# Patient Record
Sex: Male | Born: 1960 | Race: White | Hispanic: No | Marital: Married | Smoking: Never smoker
Health system: Southern US, Community
[De-identification: ages and names within clinical notes are randomized; demographics above are authoritative.]

## PROBLEM LIST (undated history)

## (undated) DIAGNOSIS — M1990 Unspecified osteoarthritis, unspecified site: Secondary | ICD-10-CM

## (undated) DIAGNOSIS — E785 Hyperlipidemia, unspecified: Secondary | ICD-10-CM

## (undated) HISTORY — PX: OTHER SURGICAL HISTORY: SHX169

## (undated) HISTORY — DX: Hyperlipidemia, unspecified: E78.5

## (undated) HISTORY — PX: COLONOSCOPY: SHX174

## (undated) HISTORY — DX: Unspecified osteoarthritis, unspecified site: M19.90

---

## 2014-01-30 DIAGNOSIS — F32A Depression, unspecified: Secondary | ICD-10-CM | POA: Insufficient documentation

## 2014-01-30 DIAGNOSIS — F329 Major depressive disorder, single episode, unspecified: Secondary | ICD-10-CM | POA: Insufficient documentation

## 2014-05-27 DIAGNOSIS — E785 Hyperlipidemia, unspecified: Secondary | ICD-10-CM | POA: Insufficient documentation

## 2014-05-27 DIAGNOSIS — Z0000 Encounter for general adult medical examination without abnormal findings: Secondary | ICD-10-CM | POA: Insufficient documentation

## 2014-05-27 DIAGNOSIS — M25551 Pain in right hip: Secondary | ICD-10-CM | POA: Insufficient documentation

## 2014-05-27 DIAGNOSIS — M25552 Pain in left hip: Secondary | ICD-10-CM

## 2015-06-25 DIAGNOSIS — Z9889 Other specified postprocedural states: Secondary | ICD-10-CM | POA: Insufficient documentation

## 2015-06-25 DIAGNOSIS — M160 Bilateral primary osteoarthritis of hip: Secondary | ICD-10-CM | POA: Insufficient documentation

## 2017-02-07 ENCOUNTER — Encounter: Payer: Self-pay | Admitting: Urology

## 2017-02-07 ENCOUNTER — Ambulatory Visit (INDEPENDENT_AMBULATORY_CARE_PROVIDER_SITE_OTHER): Payer: 59 | Admitting: Urology

## 2017-02-07 VITALS — BP 120/79 | HR 56 | Ht 70.0 in | Wt 170.0 lb

## 2017-02-07 DIAGNOSIS — K4090 Unilateral inguinal hernia, without obstruction or gangrene, not specified as recurrent: Secondary | ICD-10-CM | POA: Diagnosis not present

## 2017-02-07 DIAGNOSIS — N4340 Spermatocele of epididymis, unspecified: Secondary | ICD-10-CM

## 2017-02-07 NOTE — Progress Notes (Signed)
02/07/2017 12:26 PM   Adam [name] 1961/06/17 [id number]  Referring provider: No referring provider defined for this encounter.  Chief Complaint  Patient presents with  . Groin Swelling    New Patient    HPI: 56 year old male who presents today with a right hemiscrotal mass. He reports feeling an enlarged area, approximately size of a grape for the past 9+ months. It is not painful. Since he first noticed it, its remains stable in size.  In addition, he does also report some bulging in his right inguinal area which is new over the past several months. He is concerning may have a hernia. He denies any abdominal symptoms or pain.  He denies any voiding issues.  PMH: Past Medical History:  Diagnosis Date  . Arthritis   . Hyperlipemia     Surgical History: Past Surgical History:  Procedure Laterality Date  . none      Home Medications:  Allergies as of 02/07/2017   No Known Allergies     Medication List       Accurate as of 02/07/17 12:26 PM. Always use your most recent med list.          aspirin EC 81 MG tablet Take 81 mg by mouth.   pravastatin 10 MG tablet Commonly known as:  PRAVACHOL Take 10 mg by mouth.       Allergies: No Known Allergies  Family History: Family History  Problem Relation Age of Onset  . Prostate cancer Neg Hx   . Bladder Cancer Neg Hx   . Kidney cancer Neg Hx     Social History:  reports that he has never smoked. He has never used smokeless tobacco. He reports that he does not drink alcohol or use drugs.  ROS: UROLOGY Frequent Urination?: No Hard to postpone urination?: No Burning/pain with urination?: No Get up at night to urinate?: No Leakage of urine?: No Urine stream starts and stops?: No Trouble starting stream?: No Do you have to strain to urinate?: No Blood in urine?: No Urinary tract infection?: No Sexually transmitted disease?: No Injury to kidneys or bladder?: No Painful intercourse?: No Weak stream?:  No Erection problems?: No Penile pain?: No  Gastrointestinal Nausea?: No Vomiting?: No Indigestion/heartburn?: No Diarrhea?: No Constipation?: No  Constitutional Fever: No Night sweats?: No Weight loss?: No Fatigue?: No  Skin Skin rash/lesions?: No Itching?: No  Eyes Blurred vision?: No Double vision?: No  Ears/Nose/Throat Sore throat?: No Sinus problems?: No  Hematologic/Lymphatic Swollen glands?: No Easy bruising?: No  Cardiovascular Leg swelling?: No Chest pain?: No  Respiratory Cough?: No Shortness of breath?: No  Endocrine Excessive thirst?: No  Musculoskeletal Back pain?: No Joint pain?: No  Neurological Headaches?: No Dizziness?: No  Psychologic Depression?: No Anxiety?: No  Physical Exam: BP 120/79   Pulse (!) 56   Ht 5\' 10"  (1.778 m)   Wt 170 lb (77.1 kg)   BMI 24.39 kg/m   Constitutional:  Alert and oriented, No acute distress. HEENT: [place] AT, moist mucus membranes.  Trachea midline, no masses. Cardiovascular: No clubbing, cyanosis, or edema. Respiratory: Normal respiratory effort, no increased work of breathing. GI: Abdomen is soft, nontender, nondistended, no abdominal masses.  Bulge at the right inguinal area appreciated with Valsalva. GU: Circumcised phallus with orthotopic meatus. Bilateral descended testicles.  3 cm right epididymal mass, well-circumscribed, most consistent with spermatocele. Normal left epididymis. Normal testicles bilaterally without tenderness or masses. Skin: No rashes, bruises or suspicious lesions. Lymph: No inguinal adenopathy. Neurologic: Grossly  intact, no focal deficits, moving all 4 extremities. Psychiatric: Normal mood and affect.  Laboratory Data: N/a  Urinalysis N/a  Pertinent Imaging: n/a  Assessment & Plan:    1. Spermatocele Right 3 cm epididymal mass most consistent with spermatocele versus epididymal cyst.  Scrotal ultrasound was offered to confirm diagnosis, although quite  confident based on physical exam that this is benign.  No testicular lesions identified.  Options including continued observation versus spermatocele ectomy were discussed. At this point time, he has minimal bother therefore would prefer observation.  2. Right inguinal hernia Bulging in the right inguinal area concerning for a small hernia Observation versus referral to Gen. surgery discussed- again prefers observation of this time Wanting for incarceration was discussed  F/u prn  Vanna Scotland, MD  [facility] [address] [place], [place] [address] [phone]

## 2017-06-07 DIAGNOSIS — M1611 Unilateral primary osteoarthritis, right hip: Secondary | ICD-10-CM | POA: Diagnosis not present

## 2017-06-25 ENCOUNTER — Telehealth: Payer: Self-pay | Admitting: Cardiovascular Disease

## 2017-06-25 DIAGNOSIS — E785 Hyperlipidemia, unspecified: Secondary | ICD-10-CM

## 2017-06-25 NOTE — Telephone Encounter (Signed)
Dr. Eden EmmsNishan stated he would read his study.

## 2017-06-25 NOTE — Telephone Encounter (Signed)
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 Eden EmmsNishan would be able to read his results. Please call back to discuss

## 2017-07-04 ENCOUNTER — Ambulatory Visit (INDEPENDENT_AMBULATORY_CARE_PROVIDER_SITE_OTHER)
Admission: RE | Admit: 2017-07-04 | Discharge: 2017-07-04 | Disposition: A | Discharge status: Home / Self Care | Payer: Self-pay | Source: Ambulatory Visit | Attending: Cardiovascular Disease | Admitting: Cardiovascular Disease

## 2017-07-04 DIAGNOSIS — E785 Hyperlipidemia, unspecified: Secondary | ICD-10-CM

## 2017-07-11 ENCOUNTER — Telehealth: Payer: Self-pay

## 2017-07-11 NOTE — Telephone Encounter (Signed)
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 MD MRN [id number] DOB May 25, 2061  Please call these results to patient. [phone]. He was a self referral for scan  Thanks, Misty StanleyLisa

## 2017-07-11 NOTE — Telephone Encounter (Signed)
LEFT MSG TO SCHEDULE NP APPT . ANY PROVIDER

## 2017-07-13 NOTE — Telephone Encounter (Signed)
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 MD MRN [id number] DOB 11/16/1960  Please call these results to patient. [phone]. He was a self referral for scan  Thanks, Lisa   

## 2017-07-13 NOTE — Telephone Encounter (Signed)
Called pt to schedule f/u appt. Pt states he does not want to make an appt at this time, and will call us when he is ready to schedule

## 2017-07-16 ENCOUNTER — Other Ambulatory Visit: Payer: Self-pay | Admitting: *Deleted

## 2017-07-16 NOTE — Patient Outreach (Signed)
[facility] Care Management  07/16/2017  Adam HuskyDavid J [name] Oct 22, 1960 [id number]   Subjective: Telephone call to patient's home number, no answer, left HIPAA compliant voicemail message, and requested call back. Telephone call from patient's home number, spoke with patient, and HIPAA verified.  Discussed [facility] Transition of care follow up, preoperative call follow up, patient voiced understanding, and is in agreement to both types of follow up.   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   Patient voices understanding of medical diagnosis, pending surgery, and treatment plan. States wife will be primary caregiver post hospitalization, will assist with activities of daily living / home management as needed.  States he is accessing the following [facility] benefits: outpatient pharmacy, hospital indemnity (not chosen benefit),  will not need family medical leave act (FMLA), and is planing to take 2 weeks of vacation. Discussed Advanced Directives, advised of [employer] Employee Spiritual Care Advanced Directives document completion benefit, patient voices understanding, and  declined to access benefit at this time.  Patient states he does not have any preoperative questions, care coordination, disease management, disease monitoring, transportation, community resource, or pharmacy needs at this time. States he is very appreciative of the follow up and is in agreement to receive [facility] Care [facility] information post transition of care follow up.     Objective: Per KPN (Knowledge Performance Now, point of care tool) and chart review,  Patient to be admitted 07/30/17 for TOTAL HIP ARTHROPLASTY ANTERIOR APPROACH at [facility].   Patient has a history of hyperlipidemia, Spermatocele, and Right inguinal hernia.       Assessment:  Received [facility] Preoperative / Transition of  care referral on 07/12/17.   Preoperative call completed, and transition of care follow up pending notification of patient discharge.     Plan: RNCM will call patient for  telephone outreach attempt, transition of care follow up, within 3 business days of hospital discharge notification.      Aziza Stuckert H. Gardiner Barefootooper RN, BSN, CCM [facility] Care Management [facility] Telephonic CM Phone: [phone] Fax: [phone]

## 2017-07-17 ENCOUNTER — Ambulatory Visit: Payer: Self-pay | Admitting: Orthopedic Surgery

## 2017-07-19 ENCOUNTER — Encounter
Admission: RE | Admit: 2017-07-19 | Discharge: 2017-07-19 | Disposition: A | Discharge status: Home / Self Care | Payer: 59 | Source: Ambulatory Visit | Attending: Orthopedic Surgery | Admitting: Orthopedic Surgery

## 2017-07-19 ENCOUNTER — Other Ambulatory Visit: Payer: Self-pay

## 2017-07-19 DIAGNOSIS — Z01812 Encounter for preprocedural laboratory examination: Secondary | ICD-10-CM | POA: Insufficient documentation

## 2017-07-19 LAB — BASIC METABOLIC PANEL
ANION GAP: 10 (ref 5–15)
BUN: 18 mg/dL (ref 6–20)
CHLORIDE: 104 mmol/L (ref 101–111)
CO2: 25 mmol/L (ref 22–32)
Calcium: 8.9 mg/dL (ref 8.9–10.3)
Creatinine, Ser: 1.22 mg/dL (ref 0.61–1.24)
GFR calc Af Amer: >60 mL/min (ref >60)
GLUCOSE: 72 mg/dL (ref 65–99)
POTASSIUM: 3.5 mmol/L (ref 3.5–5.1)
Sodium: 139 mmol/L (ref 135–145)

## 2017-07-19 LAB — CBC
HEMATOCRIT: 44.7 % (ref 40.0–52.0)
Hemoglobin: 15.7 g/dL (ref 13.0–18.0)
MCH: 30.8 pg (ref 26.0–34.0)
MCHC: 35.1 g/dL (ref 32.0–36.0)
MCV: 87.8 fL (ref 80.0–100.0)
Platelets: 241 10*3/uL (ref 150–440)
RBC: 5.1 MIL/uL (ref 4.40–5.90)
RDW: 13.3 % (ref 11.5–14.5)
WBC: 6.9 10*3/uL (ref 3.8–10.6)

## 2017-07-19 LAB — URINALYSIS, ROUTINE W REFLEX MICROSCOPIC
BILIRUBIN URINE: NEGATIVE
Glucose, UA: NEGATIVE mg/dL
Hgb urine dipstick: NEGATIVE
Ketones, ur: NEGATIVE mg/dL
LEUKOCYTES UA: NEGATIVE
NITRITE: NEGATIVE
PH: 5 (ref 5.0–8.0)
Protein, ur: NEGATIVE mg/dL
SPECIFIC GRAVITY, URINE: 1.017 (ref 1.005–1.030)

## 2017-07-19 LAB — TYPE AND SCREEN
ABO/RH(D): O POS
Antibody Screen: NEGATIVE

## 2017-07-19 LAB — SURGICAL PCR SCREEN
MRSA, PCR: NEGATIVE
STAPHYLOCOCCUS AUREUS: NEGATIVE

## 2017-07-19 NOTE — Patient Instructions (Signed)
Your procedure is scheduled on: Monday, July 30, 2017 Report to Same Day Surgery on the 2nd floor in the Medical Mall. To find out your arrival time, please call [phone] between 1PM - 3PM on: Friday, July 27, 2017  REMEMBER: Instructions that are not followed completely may result in serious medical risk, up to and including death; or upon the discretion of your surgeon and anesthesiologist your surgery may need to be rescheduled.  Do not eat food after midnight the night before your procedure.  No gum chewing or hard candies.  You may however, drink CLEAR liquids up to 2 hours before you are scheduled to arrive at the hospital for your procedure.  Do not drink clear liquids within 2 hours of the start of your surgery.  Clear liquids include: - water  - apple juice without pulp - clear gatorade - black coffee or tea (Do NOT add anything to the coffee or tea) Do NOT drink anything that is not on this list.  No Alcohol for 24 hours before or after surgery.  No Smoking including e-cigarettes for 24 hours prior to surgery. No chewable tobacco products for at least 6 hours prior to surgery. No nicotine patches on the day of surgery.  On the morning of surgery brush your teeth with toothpaste and water, you may rinse your mouth with mouthwash if you wish. Do not swallow any  toothpaste of mouthwash.  Notify your doctor if there is any change in your medical condition (cold, fever, infection).  Do not wear jewelry, make-up, hairpins, clips or nail polish.  Do not wear lotions, powders, or perfumes. You may wear deodorant.  Do not shave 48 hours prior to surgery. Men may shave face and neck.  Contacts and dentures may not be worn into surgery.  Do not bring valuables to the hospital. [facility] is not responsible for any belongings or valuables.  TAKE THESE MEDICATIONS THE MORNING OF SURGERY WITH A SIP OF WATER:  none  Use CHG Soap as directed on instruction  sheet.  Starting February 3:  Stop aspirin and Anti-inflammatories such as Advil, Aleve, Ibuprofen, Motrin, Naproxen, Naprosyn, Goodie powder, or aspirin products. (May take Tylenol or Acetaminophen if needed.)  Starting February 3: Stop ANY OVER THE COUNTER supplements until after surgery.  If you are being admitted to the hospital overnight, leave your suitcase in the car. After surgery it may be brought to your room.  Please call the number above if you have any questions about these instructions.

## 2017-07-29 MED ORDER — CEFAZOLIN SODIUM-DEXTROSE 2-4 GM/100ML-% IV SOLN
2.0000 g | INTRAVENOUS | Status: AC
  Administered 2017-07-30: 2 g via INTRAVENOUS

## 2017-07-29 MED ORDER — TRANEXAMIC ACID 1000 MG/10ML IV SOLN
1000.0000 mg | INTRAVENOUS | Status: AC
  Administered 2017-07-30: 1000 mg via INTRAVENOUS
  Filled 2017-07-29: qty 10

## 2017-07-30 ENCOUNTER — Inpatient Hospital Stay
Admission: RE | Admit: 2017-07-30 | Discharge: 2017-07-31 | DRG: 470 | Disposition: A | Discharge status: Home / Self Care | Payer: 59 | Source: Ambulatory Visit | Attending: Orthopedic Surgery | Admitting: Orthopedic Surgery

## 2017-07-30 ENCOUNTER — Inpatient Hospital Stay: Payer: 59

## 2017-07-30 ENCOUNTER — Other Ambulatory Visit: Payer: Self-pay

## 2017-07-30 ENCOUNTER — Encounter: Payer: Self-pay | Admitting: *Deleted

## 2017-07-30 ENCOUNTER — Encounter
Admission: RE | Disposition: A | Discharge status: Home / Self Care | Payer: Self-pay | Source: Ambulatory Visit | Attending: Orthopedic Surgery

## 2017-07-30 ENCOUNTER — Inpatient Hospital Stay: Payer: 59 | Admitting: Anesthesiology

## 2017-07-30 DIAGNOSIS — M1611 Unilateral primary osteoarthritis, right hip: Principal | ICD-10-CM | POA: Diagnosis present

## 2017-07-30 DIAGNOSIS — Z79899 Other long term (current) drug therapy: Secondary | ICD-10-CM | POA: Diagnosis not present

## 2017-07-30 DIAGNOSIS — Z09 Encounter for follow-up examination after completed treatment for conditions other than malignant neoplasm: Secondary | ICD-10-CM

## 2017-07-30 DIAGNOSIS — E785 Hyperlipidemia, unspecified: Secondary | ICD-10-CM | POA: Diagnosis present

## 2017-07-30 DIAGNOSIS — Z419 Encounter for procedure for purposes other than remedying health state, unspecified: Secondary | ICD-10-CM

## 2017-07-30 DIAGNOSIS — Z96641 Presence of right artificial hip joint: Secondary | ICD-10-CM | POA: Diagnosis not present

## 2017-07-30 DIAGNOSIS — Z471 Aftercare following joint replacement surgery: Secondary | ICD-10-CM | POA: Diagnosis not present

## 2017-07-30 DIAGNOSIS — M25551 Pain in right hip: Secondary | ICD-10-CM | POA: Diagnosis present

## 2017-07-30 HISTORY — PX: TOTAL HIP ARTHROPLASTY: SHX124

## 2017-07-30 LAB — ABO/RH: ABO/RH(D): O POS

## 2017-07-30 SURGERY — ARTHROPLASTY, HIP, TOTAL, ANTERIOR APPROACH
Anesthesia: Spinal | Site: Hip | Laterality: Right | Wound class: Clean

## 2017-07-30 MED ORDER — ACETAMINOPHEN 650 MG RE SUPP: 650.0000 mg | RECTAL | Status: DC | PRN

## 2017-07-30 MED ORDER — FAMOTIDINE 20 MG PO TABS
ORAL_TABLET | ORAL | Status: AC
  Administered 2017-07-30: 20 mg via ORAL
  Filled 2017-07-30: qty 1

## 2017-07-30 MED ORDER — MAGNESIUM CITRATE PO SOLN
1.0000 | Freq: Once | ORAL | Status: DC | PRN
Start: 2017-07-30 — End: 2017-07-31
  Filled 2017-07-30: qty 296

## 2017-07-30 MED ORDER — DOCUSATE SODIUM 100 MG PO CAPS
100.0000 mg | ORAL_CAPSULE | Freq: Two times a day (BID) | ORAL | Status: DC
  Filled 2017-07-30: qty 1

## 2017-07-30 MED ORDER — SODIUM CHLORIDE 0.9 % IJ SOLN
INTRAMUSCULAR | Status: AC
  Filled 2017-07-30: qty 50

## 2017-07-30 MED ORDER — GABAPENTIN 300 MG PO CAPS
ORAL_CAPSULE | ORAL | Status: AC
  Administered 2017-07-30: 300 mg via ORAL
  Filled 2017-07-30: qty 1

## 2017-07-30 MED ORDER — OXYCODONE HCL 5 MG PO TABS: 5.0000 mg | ORAL_TABLET | Freq: Once | ORAL | Status: DC | PRN

## 2017-07-30 MED ORDER — BUPIVACAINE-EPINEPHRINE (PF) 0.25% -1:200000 IJ SOLN
INTRAMUSCULAR | Status: DC | PRN
  Administered 2017-07-30: 20 mL via PERINEURAL

## 2017-07-30 MED ORDER — PROPOFOL 500 MG/50ML IV EMUL
INTRAVENOUS | Status: AC
  Filled 2017-07-30: qty 100

## 2017-07-30 MED ORDER — LACTATED RINGERS IV SOLN
INTRAVENOUS | Status: DC
  Administered 2017-07-30: 17:00:00 via INTRAVENOUS

## 2017-07-30 MED ORDER — FAMOTIDINE 20 MG PO TABS
20.0000 mg | ORAL_TABLET | Freq: Once | ORAL | Status: AC
  Administered 2017-07-30: 20 mg via ORAL

## 2017-07-30 MED ORDER — BISACODYL 10 MG RE SUPP: 10.0000 mg | Freq: Every day | RECTAL | Status: DC | PRN

## 2017-07-30 MED ORDER — KETOROLAC TROMETHAMINE 15 MG/ML IJ SOLN
15.0000 mg | Freq: Four times a day (QID) | INTRAMUSCULAR | Status: AC
  Administered 2017-07-30 – 2017-07-31 (×4): 15 mg via INTRAVENOUS
  Filled 2017-07-30 (×4): qty 1

## 2017-07-30 MED ORDER — BUPIVACAINE-EPINEPHRINE (PF) 0.25% -1:200000 IJ SOLN
INTRAMUSCULAR | Status: AC
  Filled 2017-07-30: qty 20

## 2017-07-30 MED ORDER — METOCLOPRAMIDE HCL 5 MG/ML IJ SOLN: 5.0000 mg | Freq: Three times a day (TID) | INTRAMUSCULAR | Status: DC | PRN

## 2017-07-30 MED ORDER — MORPHINE SULFATE (PF) 2 MG/ML IV SOLN: 2.0000 mg | INTRAVENOUS | Status: DC | PRN

## 2017-07-30 MED ORDER — MENTHOL 3 MG MT LOZG
1.0000 | LOZENGE | OROMUCOSAL | Status: DC | PRN
  Filled 2017-07-30: qty 9

## 2017-07-30 MED ORDER — MIDAZOLAM HCL 2 MG/2ML IJ SOLN
INTRAMUSCULAR | Status: AC
  Filled 2017-07-30: qty 2

## 2017-07-30 MED ORDER — CHLORHEXIDINE GLUCONATE 4 % EX LIQD: 60.0000 mL | Freq: Once | CUTANEOUS | Status: DC

## 2017-07-30 MED ORDER — PROPOFOL 10 MG/ML IV BOLUS
INTRAVENOUS | Status: DC | PRN
  Administered 2017-07-30: 50 mg via INTRAVENOUS

## 2017-07-30 MED ORDER — PHENOL 1.4 % MT LIQD
1.0000 | OROMUCOSAL | Status: DC | PRN
  Filled 2017-07-30: qty 177

## 2017-07-30 MED ORDER — ASPIRIN EC 325 MG PO TBEC
325.0000 mg | DELAYED_RELEASE_TABLET | Freq: Every day | ORAL | Status: DC
  Administered 2017-07-31: 325 mg via ORAL
  Filled 2017-07-30: qty 1

## 2017-07-30 MED ORDER — ONDANSETRON HCL 4 MG/2ML IJ SOLN: 4.0000 mg | Freq: Four times a day (QID) | INTRAMUSCULAR | Status: DC | PRN

## 2017-07-30 MED ORDER — OXYCODONE HCL 5 MG/5ML PO SOLN: 5.0000 mg | Freq: Once | ORAL | Status: DC | PRN

## 2017-07-30 MED ORDER — OXYCODONE HCL 5 MG PO TABS: 5.0000 mg | ORAL_TABLET | ORAL | Status: DC | PRN

## 2017-07-30 MED ORDER — MIDAZOLAM HCL 5 MG/5ML IJ SOLN
INTRAMUSCULAR | Status: DC | PRN
  Administered 2017-07-30: 2 mg via INTRAVENOUS

## 2017-07-30 MED ORDER — SODIUM CHLORIDE 0.9 % IV SOLN
INTRAVENOUS | Status: DC | PRN
  Administered 2017-07-30: 25 ug/min via INTRAVENOUS

## 2017-07-30 MED ORDER — METOCLOPRAMIDE HCL 10 MG PO TABS: 5.0000 mg | ORAL_TABLET | Freq: Three times a day (TID) | ORAL | Status: DC | PRN

## 2017-07-30 MED ORDER — KETOROLAC TROMETHAMINE 60 MG/2ML IM SOLN
INTRAMUSCULAR | Status: AC
  Administered 2017-07-30: 15 mg
  Filled 2017-07-30: qty 2

## 2017-07-30 MED ORDER — ACETAMINOPHEN 500 MG PO TABS
1000.0000 mg | ORAL_TABLET | Freq: Four times a day (QID) | ORAL | Status: DC
  Administered 2017-07-30 – 2017-07-31 (×3): 1000 mg via ORAL
  Filled 2017-07-30 (×3): qty 2

## 2017-07-30 MED ORDER — FENTANYL CITRATE (PF) 100 MCG/2ML IJ SOLN: 25.0000 ug | INTRAMUSCULAR | Status: DC | PRN

## 2017-07-30 MED ORDER — MAGNESIUM HYDROXIDE 400 MG/5ML PO SUSP
30.0000 mL | Freq: Every day | ORAL | Status: DC | PRN
  Filled 2017-07-30: qty 30

## 2017-07-30 MED ORDER — ACETAMINOPHEN 500 MG PO TABS
1000.0000 mg | ORAL_TABLET | Freq: Once | ORAL | Status: AC
  Administered 2017-07-30: 1000 mg via ORAL

## 2017-07-30 MED ORDER — PRAVASTATIN SODIUM 20 MG PO TABS: 10.0000 mg | ORAL_TABLET | ORAL | Status: DC

## 2017-07-30 MED ORDER — OXYCODONE HCL 5 MG PO TABS: 10.0000 mg | ORAL_TABLET | ORAL | Status: DC | PRN

## 2017-07-30 MED ORDER — PROPOFOL 500 MG/50ML IV EMUL
INTRAVENOUS | Status: DC | PRN
  Administered 2017-07-30: 100 ug/kg/min via INTRAVENOUS

## 2017-07-30 MED ORDER — ONDANSETRON HCL 4 MG PO TABS: 4.0000 mg | ORAL_TABLET | Freq: Four times a day (QID) | ORAL | Status: DC | PRN

## 2017-07-30 MED ORDER — ACETAMINOPHEN 325 MG PO TABS
650.0000 mg | ORAL_TABLET | ORAL | Status: DC | PRN
  Administered 2017-07-31: 650 mg via ORAL
  Filled 2017-07-30: qty 2

## 2017-07-30 MED ORDER — CEFAZOLIN SODIUM-DEXTROSE 2-4 GM/100ML-% IV SOLN
INTRAVENOUS | Status: AC
  Filled 2017-07-30: qty 100

## 2017-07-30 MED ORDER — PHENYLEPHRINE HCL 10 MG/ML IJ SOLN
INTRAMUSCULAR | Status: DC | PRN
  Administered 2017-07-30 (×2): 100 ug via INTRAVENOUS

## 2017-07-30 MED ORDER — BUPIVACAINE HCL (PF) 0.5 % IJ SOLN
INTRAMUSCULAR | Status: DC | PRN
  Administered 2017-07-30: 3 mL

## 2017-07-30 MED ORDER — LACTATED RINGERS IV SOLN
INTRAVENOUS | Status: DC
  Administered 2017-07-30 (×2): via INTRAVENOUS

## 2017-07-30 MED ORDER — SENNA 8.6 MG PO TABS
1.0000 | ORAL_TABLET | Freq: Two times a day (BID) | ORAL | Status: DC
  Filled 2017-07-30: qty 1

## 2017-07-30 MED ORDER — SODIUM CHLORIDE 0.9 % IR SOLN
Status: DC | PRN
  Administered 2017-07-30: 2000 mL

## 2017-07-30 MED ORDER — ACETAMINOPHEN 500 MG PO TABS
ORAL_TABLET | ORAL | Status: AC
  Administered 2017-07-30: 1000 mg via ORAL
  Filled 2017-07-30: qty 2

## 2017-07-30 MED ORDER — CEFAZOLIN SODIUM-DEXTROSE 1-4 GM/50ML-% IV SOLN
INTRAVENOUS | Status: AC
  Administered 2017-07-30: 1 g via INTRAVENOUS
  Filled 2017-07-30: qty 50

## 2017-07-30 MED ORDER — GABAPENTIN 300 MG PO CAPS
300.0000 mg | ORAL_CAPSULE | Freq: Once | ORAL | Status: AC
  Administered 2017-07-30: 300 mg via ORAL

## 2017-07-30 MED ORDER — CEFAZOLIN SODIUM-DEXTROSE 1-4 GM/50ML-% IV SOLN
1.0000 g | Freq: Four times a day (QID) | INTRAVENOUS | Status: AC
  Administered 2017-07-30 (×2): 1 g via INTRAVENOUS
  Filled 2017-07-30: qty 50

## 2017-07-30 MED ORDER — BACITRACIN 50000 UNITS IM SOLR
INTRAMUSCULAR | Status: AC
  Filled 2017-07-30: qty 2

## 2017-07-30 MED ORDER — BUPIVACAINE HCL (PF) 0.5 % IJ SOLN
INTRAMUSCULAR | Status: AC
  Filled 2017-07-30: qty 10

## 2017-07-30 MED ORDER — ONDANSETRON HCL 4 MG/2ML IJ SOLN
INTRAMUSCULAR | Status: AC
  Filled 2017-07-30: qty 2

## 2017-07-30 MED ORDER — ONDANSETRON HCL 4 MG/2ML IJ SOLN
INTRAMUSCULAR | Status: DC | PRN
  Administered 2017-07-30: 4 mg via INTRAVENOUS

## 2017-07-30 MED ORDER — SEVOFLURANE IN SOLN
RESPIRATORY_TRACT | Status: AC
  Filled 2017-07-30: qty 250

## 2017-07-30 SURGICAL SUPPLY — 48 items
BIT DRILL STRL 25MM (BIT) ×1 IMPLANT
BLADE SAGITTAL WIDE XTHICK NO (BLADE) ×3 IMPLANT
BLADE SURG 10 STRL SS SAFETY (BLADE) ×3 IMPLANT
BRUSH SCRUB EZ  4% CHG (MISCELLANEOUS) ×2
BRUSH SCRUB EZ 4% CHG (MISCELLANEOUS) ×1 IMPLANT
CAPT HIP TOTAL 2 ×3 IMPLANT
CHLORAPREP W/TINT 26ML (MISCELLANEOUS) ×3 IMPLANT
COVER TABLE BACK 60X90 (DRAPES) ×3 IMPLANT
DRAPE C-ARM 42X72 X-RAY (DRAPES) ×3 IMPLANT
DRAPE SHEET LG 3/4 BI-LAMINATE (DRAPES) ×6 IMPLANT
DRAPE STERI IOBAN 125X83 (DRAPES) ×3 IMPLANT
DRAPE TABLE BACK 80X90 (DRAPES) ×3 IMPLANT
DRAPE U-SHAPE 47X51 STRL (DRAPES) ×3 IMPLANT
DRILL BIT STERILE 25MM (BIT) ×3
DRSG AQUACEL AG ADV 3.5X10 (GAUZE/BANDAGES/DRESSINGS) ×3 IMPLANT
DRSG AQUACEL AG ADV 3.5X14 (GAUZE/BANDAGES/DRESSINGS) ×3 IMPLANT
ELECT BLADE 6.5 EXT (BLADE) ×3 IMPLANT
ELECT REM PT RETURN 9FT ADLT (ELECTROSURGICAL) ×3
ELECTRODE REM PT RTRN 9FT ADLT (ELECTROSURGICAL) ×1 IMPLANT
GAUZE PETRO XEROFOAM 1X8 (MISCELLANEOUS) ×3 IMPLANT
GLOVE INDICATOR 8.0 STRL GRN (GLOVE) ×3 IMPLANT
GLOVE SURG ORTHO 8.0 STRL STRW (GLOVE) ×3 IMPLANT
GOWN STRL REUS W/ TWL LRG LVL3 (GOWN DISPOSABLE) ×1 IMPLANT
GOWN STRL REUS W/ TWL XL LVL3 (GOWN DISPOSABLE) ×1 IMPLANT
GOWN STRL REUS W/TWL LRG LVL3 (GOWN DISPOSABLE) ×2
GOWN STRL REUS W/TWL XL LVL3 (GOWN DISPOSABLE) ×2
HOOD PEEL AWAY FLYTE STAYCOOL (MISCELLANEOUS) ×9 IMPLANT
IV NS 1000ML (IV SOLUTION) ×2
IV NS 1000ML BAXH (IV SOLUTION) ×1 IMPLANT
KIT PATIENT CARE HANA TABLE (KITS) ×3 IMPLANT
KIT TURNOVER CYSTO (KITS) ×3 IMPLANT
MAT BLUE FLOOR 46X72 FLO (MISCELLANEOUS) ×3 IMPLANT
NDL SAFETY ECLIPSE 18X1.5 (NEEDLE) ×2 IMPLANT
NEEDLE HYPO 18GX1.5 SHARP (NEEDLE) ×4
NEEDLE HYPO 22GX1.5 SAFETY (NEEDLE) ×3 IMPLANT
NEEDLE SPNL 20GX3.5 QUINCKE YW (NEEDLE) ×3 IMPLANT
PACK HIP PROSTHESIS (MISCELLANEOUS) ×3 IMPLANT
PADDING CAST BLEND 4X4 NS (MISCELLANEOUS) ×6 IMPLANT
PILLOW ABDUCTION MEDIUM (MISCELLANEOUS) ×3 IMPLANT
PULSAVAC PLUS IRRIG FAN TIP (DISPOSABLE) ×3
SPONGE LAP 18X18 5 PK (GAUZE/BANDAGES/DRESSINGS) IMPLANT
STAPLER PROXIMATE SKIN (STAPLE) ×3 IMPLANT
SUT BONE WAX W31G (SUTURE) ×3 IMPLANT
SUT DVC 2 QUILL PDO  T11 36X36 (SUTURE) ×2
SUT DVC 2 QUILL PDO T11 36X36 (SUTURE) ×1 IMPLANT
SUT VIC AB 2-0 CT1 18 (SUTURE) ×3 IMPLANT
SYR 20CC LL (SYRINGE) ×3 IMPLANT
TIP FAN IRRIG PULSAVAC PLUS (DISPOSABLE) ×1 IMPLANT

## 2017-07-30 NOTE — H&P (Signed)
The patient has been re-examined, and the chart reviewed, and there have been no interval changes to the documented history and physical.  Plan a right total hip replacement today.  Anesthesia is not consulted regarding a peripheral nerve block for post-operative pain.  The risks, benefits, and alternatives have been discussed at length, and the patient is willing to proceed.     

## 2017-07-30 NOTE — Progress Notes (Signed)
Pt with full sensation. Up and walked to bathroom to void with no difficulty.

## 2017-07-30 NOTE — OR Nursing (Signed)
Patient awake and alert. He denies pain. Right hip sressing dry and intact. Bladder scanned for 575 mls. I + O cathed for 650 mls of clear yellow urine. Patient with some movement of both feet Spinal level is L2 bilaterally.

## 2017-07-30 NOTE — OR Nursing (Signed)
Right hip dressing dry and intact. Abductor pillow in place. Spinal level at L1 bilaterally. Right foot  Dp/Pt pulses are 2+/2+. Patient complaining of left hand index finger numbness. Will monitor.

## 2017-07-30 NOTE — OR Nursing (Signed)
Patient with slight decrease in spinal level. Down to groin level bilaterally. He denies pain at this time.

## 2017-07-30 NOTE — Transfer of Care (Signed)
Immediate Anesthesia Transfer of Care Note  Patient: Adam HuskyDavid J Steptoe  Procedure(s) Performed: TOTAL HIP ARTHROPLASTY ANTERIOR APPROACH (Right Hip)  Patient Location: PACU  Anesthesia Type:Spinal  Level of Consciousness: sedated  Airway & Oxygen Therapy: Patient Spontanous Breathing and Patient connected to face mask oxygen  Post-op Assessment: Report given to RN and Post -op Vital signs reviewed and stable  Post vital signs: Reviewed and stable  Last Vitals:  Vitals:   07/30/17 0619  BP: 125/86  Pulse: 82  Resp: 14  Temp: 36.7 C  SpO2: 99%    Last Pain:  Vitals:   07/30/17 0619  TempSrc: Tympanic  PainSc: 3          Complications: No apparent anesthesia complications

## 2017-07-30 NOTE — Op Note (Signed)
07/30/2017  9:58 AM  PATIENT:  Adam [name]   MRN: [id number]  PRE-OPERATIVE DIAGNOSIS:  Osteoarthritis right hip   POST-OPERATIVE DIAGNOSIS: Same  Procedure: Right Total Hip Replacement  Surgeon: Dola ArgyleJames R. Odis LusterBowers, MD   Assist: Altamese CabalMaurice Jones, PA-C  Anesthesia: Spinal   EBL: 200 mL   Specimens: None   Drains: None   Components used: A size 7 Polarstem Smith and Nephew, R3 size 54 mm shell, and a 36 mm Oxinium head    Description of the procedure in detail: After informed consent was obtained and the appropriate extremity marked in the pre-operative holding area, the patient was taken to the operating room and placed in the supine position on the fracture table. All pressure points were well padded and bilateral lower extremities were place in traction spars. The hip was prepped and draped in standard sterile fashion. A spinal anesthetic had been delivered by the anesthesia team. The skin and subcutaneous tissues were injected with a mixture of Marcaine with epinephrine for post-operative pain. A longitudinal incision approximately 10 cm in length was carried out from the anterior superior iliac spine to the greater trochanter. The tensor fascia was divided and blunt dissection was taken down to the level of the joint capsule. The lateral circumflex vessels were cauterized. Deep retractors were placed and a portion of the anterior capsule was excised. Using fluoroscopy the neck cut was planned and carried out with a sagittal saw. The head was passed from the field with use of a corkscrew and hip skid. Deep retractors were placed along the acetabulum and the degenerative labrum and large osteophytes were removed with a Rongeur. The cup was sequentially reamed to a size 54 mm. The wound was irrigated and using fluoroscopy the size 54 mm cup was impacted in to anatomic position. A single screw was placed followed by a threaded hole cover. The final liner was impacted in to position. Attention  was then turned to the proximal femur. The leg was placed in extension and external rotation. The canal was opened and sequentially broached to a size 7. The trial components were placed and the hip relocated. The components were found to be in good position using fluoroscopy. The hip was dislocated and the trial components removed. The final components were impacted in to position and the hip relocated. The final components were again check with fluoroscopy and found to be in good position. Hemostasis was achieved with electrocautery. The deep capsule was injected with Marcaine and epinephrine. The wound was irrigated with bacitracin laced normal saline and the tensor fascia closed with #2 Quill suture. The subcutaneous tissues were closed with 2-0 vicryl and staples for the skin. A sterile dressing was applied and an abduction pillow. Patient tolerated the procedure well and there were no apparent complication. Patient was taken to the recovery room in good condition.   Adam SmilesJames Mathew Postiglione, MD

## 2017-07-30 NOTE — Anesthesia Procedure Notes (Signed)
Date/Time: 07/30/2017 8:00 AM Performed by: Junious SilkNoles, Rochelle Larue, CRNA Pre-anesthesia Checklist: Patient identified, Emergency Drugs available, Suction available, Patient being monitored and Timeout performed Oxygen Delivery Method: Simple face mask

## 2017-07-30 NOTE — Anesthesia Preprocedure Evaluation (Signed)
Anesthesia Evaluation  Patient identified by MRN, date of birth, ID band Patient awake    Reviewed: Allergy & Precautions, H&P , NPO status , Patient's Chart, lab work & pertinent test results  Airway Mallampati: III  TM Distance: <3 FB Neck ROM: full    Dental  (+) Chipped   Pulmonary neg pulmonary ROS, neg shortness of breath,           Cardiovascular Exercise Tolerance: Good (-) angina(-) Past MI and (-) DOE negative cardio ROS       Neuro/Psych negative neurological ROS  negative psych ROS   GI/Hepatic negative GI ROS, Neg liver ROS, neg GERD  ,  Endo/Other  negative endocrine ROS  Renal/GU      Musculoskeletal  (+) Arthritis ,   Abdominal   Peds  Hematology negative hematology ROS (+)   Anesthesia Other Findings Past Medical History: No date: Arthritis     Comment:  osteoarthritis right hip No date: Hyperlipemia  Past Surgical History: No date: COLONOSCOPY No date: none     Reproductive/Obstetrics negative OB ROS                             Anesthesia Physical Anesthesia Plan  ASA: II  Anesthesia Plan: Spinal   Post-op Pain Management:    Induction:   PONV Risk Score and Plan: Propofol infusion and TIVA  Airway Management Planned: Natural Airway and Nasal Cannula  Additional Equipment:   Intra-op Plan:   Post-operative Plan:   Informed Consent: I have reviewed the patients History and Physical, chart, labs and discussed the procedure including the risks, benefits and alternatives for the proposed anesthesia with the patient or authorized representative who has indicated his/her understanding and acceptance.   Dental Advisory Given  Plan Discussed with: Anesthesiologist, CRNA and Surgeon  Anesthesia Plan Comments: (Patient reports no bleeding problems and no anticoagulant use.  Plan for spinal with backup GA  Patient consented for risks of anesthesia  including but not limited to:  - adverse reactions to medications - risk of bleeding, infection, nerve damage and headache - risk of failed spinal - damage to teeth, lips or other oral mucosa - sore throat or hoarseness - Damage to heart, brain, lungs or loss of life  Patient voiced understanding.)        Anesthesia Quick Evaluation

## 2017-07-30 NOTE — Anesthesia Procedure Notes (Signed)
Spinal  Patient location during procedure: OR Start time: 07/30/2017 7:35 AM End time: 07/30/2017 7:42 AM Staffing Resident/CRNA: Nelda Marseille, CRNA Performed: resident/CRNA  Preanesthetic Checklist Completed: patient identified, site marked, surgical consent, pre-op evaluation, timeout performed, IV checked, risks and benefits discussed and monitors and equipment checked Spinal Block Patient position: sitting Prep: Betadine Patient monitoring: heart rate, continuous pulse ox, blood pressure and cardiac monitor Approach: midline Location: L3-4 Injection technique: single-shot Needle Needle type: Whitacre and Introducer  Needle gauge: 25 G Needle length: 9 cm Assessment Sensory level: T10 Additional Notes Negative paresthesia. Negative blood return. Positive free-flowing CSF. Expiration date of kit checked and confirmed. One attempt.  One stick.   Patient tolerated procedure well, without complications.

## 2017-07-30 NOTE — OR Nursing (Signed)
Spinal level remains at the level of the groin bilaterally. Pt. Denies pain. Taking sips of po.

## 2017-07-30 NOTE — Anesthesia Post-op Follow-up Note (Signed)
Anesthesia QCDR form completed.        

## 2017-07-31 ENCOUNTER — Encounter: Payer: Self-pay | Admitting: Orthopedic Surgery

## 2017-07-31 LAB — CBC
HEMATOCRIT: 36.5 % — AB (ref 40.0–52.0)
HEMOGLOBIN: 12.8 g/dL — AB (ref 13.0–18.0)
MCH: 31 pg (ref 26.0–34.0)
MCHC: 35.1 g/dL (ref 32.0–36.0)
MCV: 88.2 fL (ref 80.0–100.0)
Platelets: 174 10*3/uL (ref 150–440)
RBC: 4.14 MIL/uL — AB (ref 4.40–5.90)
RDW: 13.1 % (ref 11.5–14.5)
WBC: 8.3 10*3/uL (ref 3.8–10.6)

## 2017-07-31 LAB — BASIC METABOLIC PANEL
ANION GAP: 6 (ref 5–15)
BUN: 14 mg/dL (ref 6–20)
CALCIUM: 8.6 mg/dL — AB (ref 8.9–10.3)
CO2: 26 mmol/L (ref 22–32)
Chloride: 106 mmol/L (ref 101–111)
Creatinine, Ser: 0.94 mg/dL (ref 0.61–1.24)
GFR calc non Af Amer: >60 mL/min (ref >60)
Glucose, Bld: 111 mg/dL — ABNORMAL HIGH (ref 65–99)
POTASSIUM: 3.8 mmol/L (ref 3.5–5.1)
Sodium: 138 mmol/L (ref 135–145)

## 2017-07-31 MED ORDER — ASPIRIN EC 325 MG PO TBEC: 325.0000 mg | DELAYED_RELEASE_TABLET | Freq: Every day | ORAL | 0 refills | Status: AC

## 2017-07-31 NOTE — Care Management Note (Signed)
Case Management Note  Patient Details  Name: Adam [name] MRN: [id number] Date of Birth: 1961-01-16  Subjective/Objective:                  RNCM met with patient to discuss transition of care. He plans to return home with his wife. He does not feel he will need home health PT.  He has no DME available for use at home.  He plans to follow up with PT as outpatient.  He uses [facility] for medications.  Action/Plan: Home health list provided.  RNCM will follow.   Expected Discharge Date:                  Expected Discharge Plan:     In-House Referral:     Discharge planning Services  CM Consult  Post Acute Care Choice:  Durable Medical Equipment Choice offered to:  Patient  DME Arranged:    DME Agency:     HH Arranged:    [place] Agency:     Status of Service:  In process, will continue to follow  If discussed at Long Length of Stay Meetings, dates discussed:    Additional Comments:  Marshell Garfinkel, RN 07/31/2017, 9:47 AM

## 2017-07-31 NOTE — Anesthesia Postprocedure Evaluation (Signed)
Anesthesia Post Note  Patient: Elonda HuskyDavid J Roessner  Procedure(s) Performed: TOTAL HIP ARTHROPLASTY ANTERIOR APPROACH (Right Hip)  Patient location during evaluation: Nursing Unit Anesthesia Type: Spinal Level of consciousness: awake, awake and alert and oriented Pain management: pain level controlled Vital Signs Assessment: post-procedure vital signs reviewed and stable Respiratory status: spontaneous breathing, nonlabored ventilation and respiratory function stable Cardiovascular status: blood pressure returned to baseline and stable Postop Assessment: no headache and no backache Anesthetic complications: no     Last Vitals:  Vitals:   07/30/17 2345 07/31/17 0424  BP: 113/73 103/71  Pulse: 85 75  Resp: 16 16  Temp: 37.5 C 36.7 C  SpO2: 97% 98%    Last Pain:  Vitals:   07/31/17 0424  TempSrc: Oral  PainSc:                  Ginger CarneStephanie Laretta Pyatt

## 2017-07-31 NOTE — [place] FL2 (Signed)
[place] MEDICAID FL2 LEVEL OF CARE SCREENING TOOL     IDENTIFICATION  Patient Name: Adam [name] Birthdate: [birth year] Sex: male Admission Date (Current Location): 07/30/2017  [place]   Facility and Address:  [facility], [address], [place], [place] [address]      Provider Number: [id number]  Attending Physician Name and Address:  Lyndle HerrlichBowers, James R, MD  Relative Name and Phone Number:       Current Level of Care: Hospital Recommended Level of Care: Skilled Nursing Facility Prior Approval Number:    Date Approved/Denied:   PASRR Number: [id number])  Discharge Plan: SNF    Current Diagnoses: Patient Active Problem List   Diagnosis Date Noted  . Status post total hip replacement, right 07/30/2017  . H/O colonoscopy 06/25/2015  . Primary osteoarthritis of both hips 06/25/2015  . Annual physical exam 05/27/2014  . Bilateral hip pain 05/27/2014  . Hyperlipidemia LDL goal <130 05/27/2014  . Depression 01/30/2014    Orientation RESPIRATION BLADDER Height & Weight     Self, Time, Situation, Place  Normal Continent Weight: 170 lb (77.1 kg) Height:  5\' 10"  (177.8 cm)  BEHAVIORAL SYMPTOMS/MOOD NEUROLOGICAL BOWEL NUTRITION STATUS      Continent Diet(Regular)  AMBULATORY STATUS COMMUNICATION OF NEEDS Skin   Extensive Assist Verbally Surgical wounds(Incision Right Hip)                       Personal Care Assistance Level of Assistance  Bathing, Feeding, Dressing Bathing Assistance: Limited assistance Feeding assistance: Independent Dressing Assistance: Limited assistance     Functional Limitations Info  Sight, Hearing, Speech Sight Info: Adequate Hearing Info: Adequate Speech Info: Adequate    SPECIAL CARE FACTORS FREQUENCY  PT (By licensed PT), OT (By licensed OT)     PT Frequency: (5) OT Frequency: (5)            Contractures      Additional Factors Info  Code Status, Allergies  Code Status Info: (Full Code) Allergies Info: (No Known Allergies)           Current Medications (07/31/2017):  This is the current hospital active medication list Current Facility-Administered Medications  Medication Dose Route Frequency Provider Last Rate Last Dose  . acetaminophen (TYLENOL) tablet 650 mg  650 mg Oral Q4H PRN Lyndle HerrlichBowers, James R, MD       Or  . acetaminophen (TYLENOL) suppository 650 mg  650 mg Rectal Q4H PRN Lyndle HerrlichBowers, James R, MD      . acetaminophen (TYLENOL) tablet 1,000 mg  1,000 mg Oral Q6H Lyndle HerrlichBowers, James R, MD   1,000 mg at 07/31/17 [id number]  . aspirin EC tablet 325 mg  325 mg Oral Q breakfast Lyndle HerrlichBowers, James R, MD   325 mg at 07/31/17 [id number]  . bisacodyl (DULCOLAX) suppository 10 mg  10 mg Rectal Daily PRN Lyndle HerrlichBowers, James R, MD      . docusate sodium (COLACE) capsule 100 mg  100 mg Oral BID Lyndle HerrlichBowers, James R, MD      . lactated ringers infusion   Intravenous Continuous Lyndle HerrlichBowers, James R, MD 50 mL/hr at 07/30/17 1726    . magnesium citrate solution 1 Bottle  1 Bottle Oral Once PRN Lyndle HerrlichBowers, James R, MD      . magnesium hydroxide (MILK OF MAGNESIA) suspension 30 mL  30 mL Oral Daily PRN Lyndle HerrlichBowers, James R, MD      . menthol-cetylpyridinium (CEPACOL) lozenge 3 mg  1  lozenge Oral PRN Lyndle Herrlich, MD       Or  . phenol (CHLORASEPTIC) mouth spray 1 spray  1 spray Mouth/Throat PRN Lyndle Herrlich, MD      . metoCLOPramide (REGLAN) tablet 5-10 mg  5-10 mg Oral Q8H PRN Lyndle Herrlich, MD       Or  . metoCLOPramide (REGLAN) injection 5-10 mg  5-10 mg Intravenous Q8H PRN Lyndle Herrlich, MD      . morphine 2 MG/ML injection 2 mg  2 mg Intravenous Q2H PRN Lyndle Herrlich, MD      . ondansetron [facility]) tablet 4 mg  4 mg Oral Q6H PRN Lyndle Herrlich, MD       Or  . ondansetron [facility]) injection 4 mg  4 mg Intravenous Q6H PRN Lyndle Herrlich, MD      . oxyCODONE (Oxy IR/ROXICODONE) immediate release tablet 10 mg  10 mg Oral Q3H PRN Lyndle Herrlich, MD      . oxyCODONE (Oxy IR/ROXICODONE)  immediate release tablet 5 mg  5 mg Oral Q3H PRN Lyndle Herrlich, MD      . pravastatin (PRAVACHOL) tablet 10 mg  10 mg Oral Monia Pouch, MD      . senna [facility]) tablet 8.6 mg  1 tablet Oral BID Lyndle Herrlich, MD         Discharge Medications: Please see discharge summary for a list of discharge medications.  Relevant Imaging Results:  Relevant Lab Results:   Additional Information (SSN: [id number])  Payton Spark, Student-Social Work

## 2017-07-31 NOTE — Progress Notes (Signed)
Physical Therapy Evaluation Patient Details Name: Adam HuskyDavid J Leyda MRN: [id number] DOB: [birth year] Today's Date: 07/31/2017   History of Present Illness  Pt underwent R THR anterior approach without reported post-op complications. PT evaluation performed on POD#1.   Clinical Impression  Pt admitted with above diagnosis. Pt currently with functional limitations due to the deficits listed below (see PT Problem List). Pt ambulates halfway around RN station with rolling walker. He is able to complete the second half of the lap with a cane only at his request. Pt demonstrates decreased L step length but improves with extended distance. Single point cane in LUE. No signs of DOE with ambulation and no reported increase in pain. Pt is stable and steady with single point cane. Pt provided cues for proper sequencing with stairs but decides to perform with step-over-step pattern. He is steady with UE support on rails. Reports that descend is more challenging. First attempt with bilateral rails and second attempt with single UE railings. Pt is able to complete seated exercise with therapist. Handouts regarding exercise progressions in sitting and standing provided to patient along with extensive education about what to expect with physical therapy. Recommend rolling walker for patient especially for nighttime ambulation and if pain increases over the next few days but pt declines at this time. He may want a cane however he might purchase independently. Will need OP PT. Pt will benefit from PT services to address deficits in strength, balance, and mobility in order to return to full function at home.     Follow Up Recommendations Outpatient PT    Equipment Recommendations  Rolling walker with 5" wheels;Other (comment)(Refuses walker. May want single point cane. Pt to notify CM)    Recommendations for Other Services       Precautions / Restrictions Precautions Precautions: Anterior Hip Precaution Booklet Issued:  Yes (comment) Precaution Comments: Pt advised not to perform SLR by Dr. Odis LusterBowers Restrictions Weight Bearing Restrictions: Yes RLE Weight Bearing: Weight bearing as tolerated      Mobility  Bed Mobility               General bed mobility comments: Received and left upright in recliner  Transfers Overall transfer level: Modified independent Equipment used: None             General transfer comment: Safe hand placement. Good sequencing. Pt is stable in standing without UE support  Ambulation/Gait Ambulation/Gait assistance: Supervision Ambulation Distance (Feet): 250 Feet Assistive device: Straight cane Gait Pattern/deviations: Decreased step length - left Gait velocity: Decreased but functional for limited community mobility   General Gait Details: Pt ambulates halfway around RN station with rolling walker. He is able to complete the second half of the lap with a cane only at his request. Pt demonstrates decreased L step length but improves with extended distance. Single point cane in LUE. No signs of DOE with ambulation and no reported increase in pain. Pt is stable and steady with single point cane  Stairs Stairs: Yes Stairs assistance: Supervision Stair Management: One rail Left;Alternating pattern Number of Stairs: 8(4+4) General stair comments: Pt provided cues for proper sequencing with stairs but decides to perform with step-over-step pattern. He is steady with UE support on rails. Reports that descend is more challenging. First attempt with bilateral rails and second attempt with single UE railings  Wheelchair Mobility    Modified Rankin (Stroke Patients Only)       Balance Overall balance assessment: Needs assistance Sitting-balance support: No upper extremity supported  Sitting balance-Leahy Scale: Normal     Standing balance support: No upper extremity supported Standing balance-Leahy Scale: Good                                Pertinent Vitals/Pain Pain Assessment: 0-10 Pain Score: 2  Pain Location: Right hip Pain Descriptors / Indicators: Aching Pain Intervention(s): Monitored during session    Home Living Family/patient expects to be discharged to:: Private residence Living Arrangements: Spouse/significant other Available Help at Discharge: Family Type of Home: House Home Access: Stairs to enter Entrance Stairs-Rails: None(Can hold door frame) Entrance Stairs-Number of Steps: 1 Home Layout: Multi-level;Other (Comment)(Plans to stay in basement) Home Equipment: None      Prior Function Level of Independence: Independent         Comments: Independent with ADLs/IADLs. No assistive device for ambulation. Drives and works full time. No falls     Hand Dominance   Dominant Hand: Right    Extremity/Trunk Assessment   Upper Extremity Assessment Upper Extremity Assessment: Overall WFL for tasks assessed    Lower Extremity Assessment Lower Extremity Assessment: RLE deficits/detail RLE Deficits / Details: Pt with gross R hip flexion and abduction weakness that he reports was pre-existing prior to surgery. Reports intact RLE sensation. Mild R knee flexion/extension weakness noted as well       Communication   Communication: No difficulties  Cognition Arousal/Alertness: Awake/alert Behavior During Therapy: WFL for tasks assessed/performed Overall Cognitive Status: Within Functional Limits for tasks assessed                                        General Comments      Exercises Total Joint Exercises Ankle Circles/Pumps: Strengthening;Both;10 reps;Seated;Other (comment)("Heel raises") Towel Squeeze: Strengthening;Both;10 reps;Seated Hip ABduction/ADduction: Strengthening;Both;10 reps;Seated Long Arc Quad: Strengthening;Right;10 reps;Seated Knee Flexion: Strengthening;Right;10 reps;Seated Marching in Standing: Strengthening;Both;10 reps;Seated Other Exercises Other  Exercises: Pt provided extensive education and handouts regarding exercise progressions in sitting and standing   Assessment/Plan    PT Assessment Patient needs continued PT services  PT Problem List Decreased strength;Decreased mobility;Decreased knowledge of use of DME;Pain       PT Treatment Interventions DME instruction;Gait training;Stair training;Functional mobility training;Therapeutic activities;Therapeutic exercise;Balance training;Neuromuscular re-education;Patient/family education    PT Goals (Current goals can be found in the Care Plan section)  Acute Rehab PT Goals Patient Stated Goal: Return to prior level of care PT Goal Formulation: With patient Time For Goal Achievement: 08/14/17 Potential to Achieve Goals: Good    Frequency BID   Barriers to discharge        Co-evaluation               AM-PAC PT "6 Clicks" Daily Activity  Outcome Measure Difficulty turning over in bed (including adjusting bedclothes, sheets and blankets)?: A Little Difficulty moving from lying on back to sitting on the side of the bed? : A Little Difficulty sitting down on and standing up from a chair with arms (e.g., wheelchair, bedside commode, etc,.)?: A Little Help needed moving to and from a bed to chair (including a wheelchair)?: None Help needed walking in hospital room?: None Help needed climbing 3-5 steps with a railing? : A Little 6 Click Score: 20    End of Session Equipment Utilized During Treatment: Gait belt Activity Tolerance: Patient tolerated treatment well Patient left: in chair;with nursing/sitter  in room Nurse Communication: Mobility status PT Visit Diagnosis: Muscle weakness (generalized) (M62.81);Other abnormalities of gait and mobility (R26.89);Pain Pain - Right/Left: Right Pain - part of body: Hip    Time: 0900-0950 PT Time Calculation (min) (ACUTE ONLY): 50 min   Charges:   PT Evaluation $PT Eval Low Complexity: 1 Low PT Treatments $Gait Training:  8-22 mins $Therapeutic Exercise: 8-22 mins   PT G Codes:        Sharalyn Ink Janace Decker PT, DPT    Adler Chartrand 07/31/2017, 10:09 AM

## 2017-07-31 NOTE — Progress Notes (Signed)
D/C teachings done both written and verbal. All questions answered. No change in pt from AM assessment. Pt pain controlled for discharge.

## 2017-07-31 NOTE — Progress Notes (Signed)
Physical Therapy Treatment Patient Details Name: Adam HuskyDavid J [name] MRN: [id number] DOB: [birth year] Today's Date: 07/31/2017    History of Present Illness Pt underwent R THR anterior approach without reported post-op complications. PT evaluation performed on POD#1.     PT Comments    Pt making excellent progress with therapy. He is modified independent for transfers and demonstrates good safety and stability in standing without UE support. He is again able to complete a full lap around RN station and into the rehab gym with therapist. Able to complete the full distance with a single point cane this afternoon. Gait is mildly antagic with slight decrease in L step length but improves throughout distance. No RLE buckling noted. Pt denies DOE with ambulation and no signs of respiratory distress. Denies chest pain at rest. He is able to complete both seated and standing exercises with therapist. Pt is safe to discharge when medically appropriate. Pt will benefit from PT services to address deficits in strength, balance, and mobility in order to return to full function at home.    Follow Up Recommendations  Outpatient PT     Equipment Recommendations  Rolling walker with 5" wheels;Other (comment)(Refuses walker. May want single point cane. Pt to notify CM)    Recommendations for Other Services       Precautions / Restrictions Precautions Precautions: Anterior Hip Precaution Booklet Issued: Yes (comment) Precaution Comments: Pt advised not to perform SLR by Dr. Odis [name] Restrictions Weight Bearing Restrictions: Yes RLE Weight Bearing: Weight bearing as tolerated    Mobility  Bed Mobility               General bed mobility comments: Received and left upright in recliner  Transfers Overall transfer level: Modified independent Equipment used: None             General transfer comment: Safe hand placement. Good sequencing. Pt is stable in standing without UE support. Continued  improvement from AM session  Ambulation/Gait Ambulation/Gait assistance: Supervision Ambulation Distance (Feet): 250 Feet Assistive device: Straight cane Gait Pattern/deviations: Decreased step length - left Gait velocity: Decreased but functional for limited community mobility   General Gait Details: Pt is again able to complete a full lap around RN station and into the rehab gym with therapist. Able to complete the full distance with a single point cane today. Gait is mildly antagic with slight decrease in L step length but improves throughout distances. No RLE buckling noted. Pt denies DOE with ambulation and no signs of respiratory distress. Denies chest pain at rest   Stairs Stairs: Yes   Stair Management: One rail Left;Alternating pattern Number of Stairs: 12 General stair comments: No cues required this afternoon for stairs. Pt able to complete 12 steps with unilateral railing and support from cane. Good stability and safety demonstrated. Reciprocal stepping pattern  Wheelchair Mobility    Modified Rankin (Stroke Patients Only)       Balance Overall balance assessment: Needs assistance Sitting-balance support: No upper extremity supported Sitting balance-Leahy Scale: Normal     Standing balance support: No upper extremity supported Standing balance-Leahy Scale: Good                              Cognition Arousal/Alertness: Awake/alert Behavior During Therapy: WFL for tasks assessed/performed Overall Cognitive Status: Within Functional Limits for tasks assessed  Exercises Total Joint Exercises Ankle Circles/Pumps: Strengthening;Both;10 reps;Seated;Other (comment)("Heel raises", also x 10 in standing) Towel Squeeze: Strengthening;Both;10 reps;Seated Hip ABduction/ADduction: Strengthening;Both;10 reps;Seated;Other (comment)(Standing hip abduction x 10 bilateral) Long Arc Quad: Strengthening;Right;10  reps;Seated Knee Flexion: Strengthening;Right;10 reps;Seated Marching in Standing: Strengthening;Both;10 reps;Seated(x 10 in standing as well) Standing Hip Extension: Strengthening;Both;10 reps;Standing    General Comments        Pertinent Vitals/Pain Pain Assessment: 0-10 Pain Score: 3  Pain Location: Right hip Pain Descriptors / Indicators: Aching Pain Intervention(s): Monitored during session    Home Living                      Prior Function            PT Goals (current goals can now be found in the care plan section) Acute Rehab PT Goals Patient Stated Goal: Return to prior level of care PT Goal Formulation: With patient Time For Goal Achievement: 08/14/17 Potential to Achieve Goals: Good Progress towards PT goals: Progressing toward goals    Frequency    BID      PT Plan Current plan remains appropriate    Co-evaluation              AM-PAC PT "6 Clicks" Daily Activity  Outcome Measure  Difficulty turning over in bed (including adjusting bedclothes, sheets and blankets)?: A Little Difficulty moving from lying on back to sitting on the side of the bed? : A Little Difficulty sitting down on and standing up from a chair with arms (e.g., wheelchair, bedside commode, etc,.)?: None Help needed moving to and from a bed to chair (including a wheelchair)?: None Help needed walking in hospital room?: None Help needed climbing 3-5 steps with a railing? : None 6 Click Score: 22    End of Session   Activity Tolerance: Patient tolerated treatment well Patient left: in chair;with family/visitor present Nurse Communication: Mobility status PT Visit Diagnosis: Muscle weakness (generalized) (M62.81);Other abnormalities of gait and mobility (R26.89);Pain Pain - Right/Left: Right Pain - part of body: Hip     Time: 1610-9604 PT Time Calculation (min) (ACUTE ONLY): 23 min  Charges:  $Gait Training: 8-22 mins $Therapeutic Exercise: 8-22 mins                     G Codes:      Adam [name] PT, DPT     Adam [name] 07/31/2017, 2:03 PM

## 2017-07-31 NOTE — Discharge Summary (Signed)
Physician Discharge Summary  Patient ID: Adam [name] MRN: [id number] DOB/AGE: September 30, 1960 57 y.o.  Admit date: 07/30/2017 Discharge date: 07/31/2017  Admission Diagnoses:  M16.11 Unilateral primary osteoarthritis, right hip <principal problem not specified>  Discharge Diagnoses:  M16.11 Unilateral primary osteoarthritis, right hip Active Problems:   Status post total hip replacement, right   Past Medical History:  Diagnosis Date  . Arthritis    osteoarthritis right hip  . Hyperlipemia     Surgeries: Procedure(s): TOTAL HIP ARTHROPLASTY ANTERIOR APPROACH on 07/30/2017   Consultants (if any):   Discharged Condition: Improved  Hospital Course: Adam HuskyDavid J Michl is an 57 y.o. male who was admitted 07/30/2017 with a diagnosis of  M16.11 Unilateral primary osteoarthritis, right hip <principal problem not specified> and went to the operating room on 07/30/2017 and underwent the above named procedures.    He was given perioperative antibiotics:  Anti-infectives (From admission, onward)   Start     Dose/Rate Route Frequency Ordered Stop   07/30/17 1215  ceFAZolin (ANCEF) IVPB 1 g/50 mL premix     1 g 100 mL/hr over 30 Minutes Intravenous Every 6 hours 07/30/17 1204 07/30/17 1757   07/30/17 0841  50,000 units bacitracin in 0.9% normal saline 250 mL irrigation  Status:  Discontinued       As needed 07/30/17 0841 07/30/17 0949   07/30/17 0600  ceFAZolin (ANCEF) IVPB 2g/100 mL premix     2 g 200 mL/hr over 30 Minutes Intravenous On call to O.R. 07/29/17 2208 07/30/17 0810   07/30/17 0600  ceFAZolin (ANCEF) 2-4 GM/100ML-% IVPB    Comments:  Machia, Millissa   : cabinet override      07/30/17 0600 07/30/17 0800    .  He was given sequential compression devices, early ambulation, and ECASA for DVT prophylaxis.  He benefited maximally from the hospital stay and there were no complications.    Recent vital signs:  Vitals:   07/31/17 0424 07/31/17 0837  BP: 103/71 126/82  Pulse: 75 82   Resp: 16 20  Temp: 98.1 F (36.7 C) 98.7 F (37.1 C)  SpO2: 98% 99%    Recent laboratory studies:  Lab Results  Component Value Date   HGB 12.8 (L) 07/31/2017   HGB 15.7 07/19/2017   Lab Results  Component Value Date   WBC 8.3 07/31/2017   PLT 174 07/31/2017   No results found for: INR Lab Results  Component Value Date   NA 138 07/31/2017   K 3.8 07/31/2017   CL 106 07/31/2017   CO2 26 07/31/2017   BUN 14 07/31/2017   CREATININE 0.94 07/31/2017   GLUCOSE 111 (H) 07/31/2017    Discharge Medications:   Allergies as of 07/31/2017   No Known Allergies     Medication List    STOP taking these medications   ibuprofen 200 MG tablet Commonly known as:  ADVIL,MOTRIN   pravastatin 10 MG tablet Commonly known as:  PRAVACHOL     TAKE these medications   aspirin EC 325 MG tablet Take 1 tablet (325 mg total) by mouth daily for 30 doses. What changed:    medication strength  how much to take            Durable Medical Equipment  (From admission, onward)        Start     Ordered   07/30/17 1518  DME Walker rolling  Once    Question:  Patient needs a walker to treat with the following condition  Answer:  S/P TKR (total knee replacement), right   07/30/17 1517      Diagnostic Studies: Ct Cardiac Scoring  Addendum Date: 07/04/2017   ADDENDUM REPORT: 07/04/2017 13:30 CLINICAL DATA:  Risk stratification EXAM: Coronary Calcium Score TECHNIQUE: The patient was scanned on a Siemens Somatom 64 slice scanner. Axial non-contrast 3 mm slices were carried out through the heart. The data set was analyzed on a dedicated work station and scored using the Agatson method. FINDINGS: Non-cardiac: See separate report from [facility]. Ascending Aorta: Normal 3.3 cm Pericardium: Normal Coronary arteries: Calcium noted in proximal LAD and one punctate area in proximal circumflex IMPRESSION: Coronary calcium score of 90. This was 16 rd percentile for age and sex matched  control. Charlton Haws Electronically Signed   By: Charlton Haws M.D.   On: 07/04/2017 13:30   Result Date: 07/04/2017 EXAM: OVER-READ INTERPRETATION  CT CHEST The following report is an over-read performed by radiologist Dr. Richarda Overlie of [facility] on 07/04/2017. This over-read does not include interpretation of cardiac or coronary anatomy or pathology. The coronary calcium score interpretation by the cardiologist is attached. COMPARISON:  None. FINDINGS: Vascular: Normal caliber of the visualized thoracic aorta. Heart size is normal. No significant pericardial fluid. Mediastinum/Nodes: No significant lymph node enlargement in the visualized mediastinum or hila. Lungs/Pleura: Lungs are clear.  No pleural effusions. Upper Abdomen: 1.2 cm low-density structure at the left hepatic dome is nonspecific. Otherwise, images of the upper abdomen are unremarkable. Musculoskeletal: No acute abnormality. IMPRESSION: No acute abnormality. 1.2 cm low-density structure in the left hepatic lobe is nonspecific. This is likely an incidental finding unless the patient has history or concern for malignancy. Electronically Signed: By: Richarda Overlie M.D. On: 07/04/2017 10:21   Dg Pelvis Portable  Result Date: 07/30/2017 CLINICAL DATA:  57 year old male post total right hip replacement. Subsequent encounter. EXAM: PORTABLE PELVIS 1-2 VIEWS COMPARISON:  Intraoperative C-arm views 07/30/2017. FINDINGS: Post total right hip replacement which appears in satisfactory position without complication noted on single frontal projection. Moderate to marked left hip joint degenerative changes. IMPRESSION: Post total right hip replacement without complication noted on frontal projection. Moderate to marked left hip joint degenerative changes. Electronically Signed   By: Lacy Duverney M.D.   On: 07/30/2017 10:42   Dg Hip Operative Unilat W Or W/o Pelvis Right  Result Date: 07/30/2017 CLINICAL DATA:  Status post right hip joint  prosthesis placement. EXAM: OPERATIVE right HIP (WITH PELVIS IF PERFORMED) 2 fluoro spot VIEWS. TECHNIQUE: Fluoroscopic spot image(s) were submitted for interpretation post-operatively. COMPARISON:  None. FINDINGS: Two AP fluoro spot images reveal placement of right hip joint prosthesis. Radiographic positioning of the prosthetic components is good. The interface with the native bone is normal. IMPRESSION: No immediate postprocedure complication following right hip joint prosthesis placement. Electronically Signed   By: Jed  [place] M.D.   On: 07/30/2017 09:38    Disposition: Home with outpatient physical therapy       Signed: Lyndle Herrlich ,MD 07/31/2017, 3:28 PM

## 2017-07-31 NOTE — Progress Notes (Signed)
Clinical Social Worker (CSW) received SNF consult. PT is recommending outpatient PT. RN case manager aware of above. Please reconsult if future social work needs arise. CSW signing off.   Rashad Auld, LCSW [phone]  

## 2017-07-31 NOTE — Progress Notes (Signed)
  Subjective:  Patient reports pain as moderate.    Objective:   VITALS:   Vitals:   07/30/17 1839 07/30/17 2004 07/30/17 2345 07/31/17 0424  BP:  117/67 113/73 103/71  Pulse: 77 96 85 75  Resp: 18 16 16 16   Temp:  98.5 F (36.9 C) 99.5 F (37.5 C) 98.1 F (36.7 C)  TempSrc:  Oral Oral Oral  SpO2:  96% 97% 98%  Weight: 77.1 kg (170 lb)     Height:        PHYSICAL EXAM:  Neurovascular intact Dorsiflexion/Plantar flexion intact Incision: dressing C/D/I Compartment soft  LABS  Results for orders placed or performed during the hospital encounter of 07/30/17 (from the past 24 hour(s))  CBC     Status: Abnormal   Collection Time: 07/31/17  6:28 AM  Result Value Ref Range   WBC 8.3 3.8 - 10.6 K/uL   RBC 4.14 (L) 4.40 - 5.90 MIL/uL   Hemoglobin 12.8 (L) 13.0 - 18.0 g/dL   HCT 09.836.5 (L) 11.940.0 - 14.752.0 %   MCV 88.2 80.0 - 100.0 fL   MCH 31.0 26.0 - 34.0 pg   MCHC 35.1 32.0 - 36.0 g/dL   RDW 82.913.1 56.211.5 - 13.014.5 %   Platelets 174 150 - 440 K/uL  Basic metabolic panel     Status: Abnormal   Collection Time: 07/31/17  6:28 AM  Result Value Ref Range   Sodium 138 135 - 145 mmol/L   Potassium 3.8 3.5 - 5.1 mmol/L   Chloride 106 101 - 111 mmol/L   CO2 26 22 - 32 mmol/L   Glucose, Bld 111 (H) 65 - 99 mg/dL   BUN 14 6 - 20 mg/dL   Creatinine, Ser 8.650.94 0.61 - 1.24 mg/dL   Calcium 8.6 (L) 8.9 - 10.3 mg/dL   GFR calc non Af Amer >60 >60 mL/min   GFR calc Af Amer >60 >60 mL/min   Anion gap 6 5 - 15    Dg Pelvis Portable  Result Date: 07/30/2017 CLINICAL DATA:  57 year old male post total right hip replacement. Subsequent encounter. EXAM: PORTABLE PELVIS 1-2 VIEWS COMPARISON:  Intraoperative C-arm views 07/30/2017. FINDINGS: Post total right hip replacement which appears in satisfactory position without complication noted on single frontal projection. Moderate to marked left hip joint degenerative changes. IMPRESSION: Post total right hip replacement without complication noted on  frontal projection. Moderate to marked left hip joint degenerative changes. Electronically Signed   By: Lacy DuverneySteven  Olson M.D.   On: 07/30/2017 10:42   Dg Hip Operative Unilat W Or W/o Pelvis Right  Result Date: 07/30/2017 CLINICAL DATA:  Status post right hip joint prosthesis placement. EXAM: OPERATIVE right HIP (WITH PELVIS IF PERFORMED) 2 fluoro spot VIEWS. TECHNIQUE: Fluoroscopic spot image(s) were submitted for interpretation post-operatively. COMPARISON:  None. FINDINGS: Two AP fluoro spot images reveal placement of right hip joint prosthesis. Radiographic positioning of the prosthetic components is good. The interface with the native bone is normal. IMPRESSION: No immediate postprocedure complication following right hip joint prosthesis placement. Electronically Signed   By: Tadarius  Swazil[place]Jordan M.D.   On: 07/30/2017 09:38    Assessment/Plan: 1 Day Post-Op   Active Problems:   Status post total hip replacement, right    Up with therapy D/C IV fluids Plan for discharge possibly today if passes PT   Lyndle HerrlichJames R Torryn Hudspeth , MD 07/31/2017, 7:33 AM

## 2017-07-31 NOTE — Discharge Instructions (Signed)

## 2017-07-31 NOTE — Care Management (Signed)
Patient advised to pick up or borrow a cane for ambulatory needs- patient agrees. No other RNCM needs.

## 2017-07-31 NOTE — Progress Notes (Signed)
Pt discharged to home via wheelchair without incident per MD order accompanied by family.

## 2017-08-01 ENCOUNTER — Other Ambulatory Visit: Payer: Self-pay | Admitting: *Deleted

## 2017-08-01 LAB — SURGICAL PATHOLOGY

## 2017-08-01 NOTE — Patient Outreach (Signed)
[facility]) Care Management  08/01/2017  Adam HuskyDavid J [name] [birth year] [id number]   Subjective: Telephone call to patient's home number, no answer, left HIPAA compliant voicemail message, and requested call back.   Objective: Per KPN (Knowledge Performance Now, point of care tool) and chart review,  Patient hospitalized  07/30/17 -07/31/17 for Unilateral primary osteoarthritis, right hip.    Status post TOTAL RIGHT HIP ARTHROPLAST[facility]TERIOR [facility].   Patient has a history of hyperlipidemia, Spermatocele, and Right inguinal hernia.       Assessment:  Received [facility] Preoperative / Transition of care referral on 07/12/17.   Preoperative call completed, and Transition of care follow up pending patient contact.     Plan: RNCM will call patient for 2nd telephone outreach attempt, transition of care follow up, within 10 business days if no return call.       Karliah Kowalchuk H. Gardiner Barefootooper RN, BSN, CCM [facility] Care [facility] Telephonic CM Phone: [phone] Fax: [phone]

## 2017-08-02 ENCOUNTER — Encounter: Payer: Self-pay | Admitting: *Deleted

## 2017-08-02 ENCOUNTER — Other Ambulatory Visit: Payer: Self-pay | Admitting: *Deleted

## 2017-08-02 ENCOUNTER — Ambulatory Visit: Payer: Self-pay | Admitting: *Deleted

## 2017-08-02 NOTE — Patient Outreach (Signed)
[facility]) Care Management  08/02/2017  Adam [name] 1961-01-05 [id number]   Subjective: Telephone call to patient's home number, no answer, left HIPAA compliant voicemail message, and requested call back.   Objective:Per KPN (Knowledge Performance Now, point of care tool) and chart review,Patient hospitalized  07/30/17 -07/31/17 forUnilateral primary osteoarthritis, right hip.    Status post TOTAL RIGHT HIP [facility]THR[facility]. Patient has a history of hyperlipidemia, Spermatocele, andRight inguinal hernia.      Assessment:Received [facility] Preoperative / Transition of care referral on 07/12/17. Preoperative call completed, and Transition of care follow up pending patient contact.     Plan:RNCM will send unsuccessful outreach  letter, [facility] pamph[facility], will call patient for 3rd telephone outreach attempt, transition of care follow up, and proceed with case closure, within 10 business days if no return call.      Dara Beidleman H. Gardiner Barefootooper RN, BSN, CCM [facility] Care Management [facility] Telephonic CM Phone: [phone] Fax: [phone]

## 2017-08-02 NOTE — Patient Outreach (Signed)
[facility]) Care Management  08/02/2017  Elonda HuskyDavid J Buckingham 08-Sep-1960 [id number]   Subjective: Telephone call from patient's home / mobile number, spoke with patient, and HIPAA verified.  [facility] Transition of care follow up, patient voiced understanding, and is in agreement to follow up.   Patient states he is returning call, doing fine, had a great experience, has follow up in place,  did not want to answer anymore questions has been receiving a lot of postop calls, and does not like it.  Brief transition of care follow up completed, patient declined medication review, and further assessment.  [facility] benefits discussed on 1/281/9 preoperative call and patient states no additional questions at this time. Patient states he does not have any education material, transition of care, care coordination, disease management, disease monitoring, transportation, community resource, or pharmacy needs at this time.  States he is very appreciative of the follow up and is in agreement to receive [facility] information.     Objective:Per KPN (Knowledge Performance Now, point of care tool) and chart review,Patient hospitalized  07/30/17 -07/31/17 forUnilateral primary osteoarthritis, right hip.    Status post TOTAL RIGHT HIP ARTHROPLAST[facility]IOR [facility]. Patient has a history of hyperlipidemia, Spermatocele, andRight inguinal hernia.      Assessment:Received [facility] Preoperative / Transition of care referral on 07/12/17. Preoperative call completed, and brief Transition of care follow up completed per patient's request, no care management needs, and will proceed with case closure.      Plan:RNCM will send patient successful outreach letter, [facility] pamphlet, and magnet. RNCM will send case closure due to follow up completed / no care management needs request to Iverson AlaminLaura Greeson [facility].       Shalinda Burkholder H.  Gardiner Barefootooper RN, BSN, CCM [facility] Care [facility] Telephonic CM Phone: [phone] Fax: [phone]

## 2017-08-06 DIAGNOSIS — R262 Difficulty in walking, not elsewhere classified: Secondary | ICD-10-CM | POA: Diagnosis not present

## 2017-08-06 DIAGNOSIS — M25551 Pain in right hip: Secondary | ICD-10-CM | POA: Diagnosis not present

## 2017-08-09 DIAGNOSIS — M25551 Pain in right hip: Secondary | ICD-10-CM | POA: Diagnosis not present

## 2017-08-09 DIAGNOSIS — R262 Difficulty in walking, not elsewhere classified: Secondary | ICD-10-CM | POA: Diagnosis not present

## 2017-08-15 ENCOUNTER — Ambulatory Visit: Payer: Self-pay | Admitting: *Deleted

## 2017-08-16 DIAGNOSIS — M25551 Pain in right hip: Secondary | ICD-10-CM | POA: Diagnosis not present

## 2017-08-16 DIAGNOSIS — R262 Difficulty in walking, not elsewhere classified: Secondary | ICD-10-CM | POA: Diagnosis not present

## 2017-08-21 DIAGNOSIS — M25551 Pain in right hip: Secondary | ICD-10-CM | POA: Diagnosis not present

## 2017-08-21 DIAGNOSIS — R262 Difficulty in walking, not elsewhere classified: Secondary | ICD-10-CM | POA: Diagnosis not present

## 2017-08-23 DIAGNOSIS — M25551 Pain in right hip: Secondary | ICD-10-CM | POA: Diagnosis not present

## 2017-08-23 DIAGNOSIS — R262 Difficulty in walking, not elsewhere classified: Secondary | ICD-10-CM | POA: Diagnosis not present

## 2017-08-28 DIAGNOSIS — R262 Difficulty in walking, not elsewhere classified: Secondary | ICD-10-CM | POA: Diagnosis not present

## 2017-08-28 DIAGNOSIS — M25551 Pain in right hip: Secondary | ICD-10-CM | POA: Diagnosis not present

## 2017-08-30 DIAGNOSIS — R262 Difficulty in walking, not elsewhere classified: Secondary | ICD-10-CM | POA: Diagnosis not present

## 2017-08-30 DIAGNOSIS — M25551 Pain in right hip: Secondary | ICD-10-CM | POA: Diagnosis not present

## 2017-09-04 DIAGNOSIS — M25551 Pain in right hip: Secondary | ICD-10-CM | POA: Diagnosis not present

## 2017-09-04 DIAGNOSIS — R262 Difficulty in walking, not elsewhere classified: Secondary | ICD-10-CM | POA: Diagnosis not present

## 2017-09-06 DIAGNOSIS — R262 Difficulty in walking, not elsewhere classified: Secondary | ICD-10-CM | POA: Diagnosis not present

## 2017-09-06 DIAGNOSIS — M25551 Pain in right hip: Secondary | ICD-10-CM | POA: Diagnosis not present

## 2017-09-11 DIAGNOSIS — R262 Difficulty in walking, not elsewhere classified: Secondary | ICD-10-CM | POA: Diagnosis not present

## 2017-09-11 DIAGNOSIS — M25551 Pain in right hip: Secondary | ICD-10-CM | POA: Diagnosis not present

## 2017-09-13 DIAGNOSIS — R262 Difficulty in walking, not elsewhere classified: Secondary | ICD-10-CM | POA: Diagnosis not present

## 2017-09-13 DIAGNOSIS — M25551 Pain in right hip: Secondary | ICD-10-CM | POA: Diagnosis not present

## 2017-09-18 DIAGNOSIS — M25551 Pain in right hip: Secondary | ICD-10-CM | POA: Diagnosis not present

## 2017-09-18 DIAGNOSIS — R262 Difficulty in walking, not elsewhere classified: Secondary | ICD-10-CM | POA: Diagnosis not present

## 2017-09-18 DIAGNOSIS — Z809 Family history of malignant neoplasm, unspecified: Secondary | ICD-10-CM | POA: Diagnosis not present

## 2017-11-06 DIAGNOSIS — E785 Hyperlipidemia, unspecified: Secondary | ICD-10-CM | POA: Diagnosis not present

## 2017-11-06 DIAGNOSIS — Z0000 Encounter for general adult medical examination without abnormal findings: Secondary | ICD-10-CM | POA: Diagnosis not present

## 2017-11-06 DIAGNOSIS — M160 Bilateral primary osteoarthritis of hip: Secondary | ICD-10-CM | POA: Diagnosis not present

## 2017-11-06 DIAGNOSIS — M519 Unspecified thoracic, thoracolumbar and lumbosacral intervertebral disc disorder: Secondary | ICD-10-CM | POA: Diagnosis not present

## 2017-12-12 DIAGNOSIS — E875 Hyperkalemia: Secondary | ICD-10-CM | POA: Diagnosis not present

## 2017-12-12 DIAGNOSIS — Z0000 Encounter for general adult medical examination without abnormal findings: Secondary | ICD-10-CM | POA: Diagnosis not present

## 2018-01-31 DIAGNOSIS — H524 Presbyopia: Secondary | ICD-10-CM | POA: Diagnosis not present

## 2018-04-24 ENCOUNTER — Ambulatory Visit: Payer: Self-pay | Admitting: Orthopedic Surgery

## 2018-04-24 DIAGNOSIS — K641 Second degree hemorrhoids: Secondary | ICD-10-CM | POA: Diagnosis not present

## 2018-04-24 DIAGNOSIS — Z86010 Personal history of colonic polyps: Secondary | ICD-10-CM | POA: Diagnosis not present

## 2018-04-24 DIAGNOSIS — K5730 Diverticulosis of large intestine without perforation or abscess without bleeding: Secondary | ICD-10-CM | POA: Diagnosis not present

## 2018-05-10 ENCOUNTER — Other Ambulatory Visit: Payer: Self-pay

## 2018-05-10 ENCOUNTER — Encounter
Admission: RE | Admit: 2018-05-10 | Discharge: 2018-05-10 | Disposition: A | Discharge status: Home / Self Care | Payer: 59 | Source: Ambulatory Visit | Attending: Orthopedic Surgery | Admitting: Orthopedic Surgery

## 2018-05-10 DIAGNOSIS — Z01812 Encounter for preprocedural laboratory examination: Secondary | ICD-10-CM | POA: Diagnosis not present

## 2018-05-10 LAB — BASIC METABOLIC PANEL
Anion gap: 11 (ref 5–15)
BUN: 20 mg/dL (ref 6–20)
CO2: 26 mmol/L (ref 22–32)
CREATININE: 1.49 mg/dL — AB (ref 0.61–1.24)
Calcium: 9.3 mg/dL (ref 8.9–10.3)
Chloride: 104 mmol/L (ref 98–111)
GFR calc non Af Amer: 50 mL/min — ABNORMAL LOW (ref >60)
GFR, EST AFRICAN AMERICAN: 58 mL/min — AB (ref >60)
GLUCOSE: 67 mg/dL — AB (ref 70–99)
Potassium: 3.8 mmol/L (ref 3.5–5.1)
Sodium: 141 mmol/L (ref 135–145)

## 2018-05-10 LAB — CBC
HEMATOCRIT: 44.4 % (ref 39.0–52.0)
Hemoglobin: 15.5 g/dL (ref 13.0–17.0)
MCH: 30.9 pg (ref 26.0–34.0)
MCHC: 34.9 g/dL (ref 30.0–36.0)
MCV: 88.4 fL (ref 80.0–100.0)
NRBC: 0 % (ref 0.0–0.2)
Platelets: 239 10*3/uL (ref 150–400)
RBC: 5.02 MIL/uL (ref 4.22–5.81)
RDW: 13.1 % (ref 11.5–15.5)
WBC: 7.8 10*3/uL (ref 4.0–10.5)

## 2018-05-10 LAB — URINALYSIS, ROUTINE W REFLEX MICROSCOPIC
BACTERIA UA: NONE SEEN
BILIRUBIN URINE: NEGATIVE
Glucose, UA: NEGATIVE mg/dL
KETONES UR: NEGATIVE mg/dL
Leukocytes, UA: NEGATIVE
NITRITE: NEGATIVE
Protein, ur: NEGATIVE mg/dL
SPECIFIC GRAVITY, URINE: 1.016 (ref 1.005–1.030)
SQUAMOUS EPITHELIAL / LPF: NONE SEEN (ref 0–5)
pH: 6 (ref 5.0–8.0)

## 2018-05-10 LAB — SURGICAL PCR SCREEN
MRSA, PCR: NEGATIVE
STAPHYLOCOCCUS AUREUS: NEGATIVE

## 2018-05-10 LAB — TYPE AND SCREEN
ABO/RH(D): O POS
Antibody Screen: NEGATIVE

## 2018-05-10 LAB — PROTIME-INR
INR: 0.9
Prothrombin Time: 12.1 seconds (ref 11.4–15.2)

## 2018-05-10 LAB — APTT: aPTT: 32 seconds (ref 24–36)

## 2018-05-10 NOTE — Patient Instructions (Signed)
Your procedure is scheduled on: Friday 05/24/18.  Report to DAY SURGERY DEPARTMENT LOCATED ON 2ND FLOOR MEDICAL MALL ENTRANCE. To find out your arrival time please call [phone] between 1PM - 3PM on Thursday 05/23/18.    Remember: Instructions that are not followed completely may result in serious medical risk, up to and including death, or upon the discretion of your surgeon and anesthesiologist your surgery may need to be rescheduled.       _X__ 1. Do not eat food after midnight the night before your procedure.                 No gum chewing or hard candies. You may drink clear liquids up to 2 hours                 before you are scheduled to arrive for your surgery- DO NOT drink clear                 liquids within 2 hours of the start of your surgery.                 Clear Liquids include:  water, apple juice without pulp, clear carbohydrate                 drink such as Clearfast or Gatorade, Black Coffee or Tea (Do not add                 anything to coffee or tea).    __X__2.  On the morning of surgery brush your teeth with toothpaste and water, you may rinse your mouth with mouthwash if you wish.  Do not swallow any toothpaste or mouthwash.      _X__ 3.  No Alcohol for 24 hours before or after surgery.    __X__4.  Notify your doctor if there is any change in your medical condition      (cold, fever, infections).     Do not wear jewelry, make-up, hairpins, clips or nail polish. Do not wear lotions, powders, or perfumes.  Do not shave 48 hours prior to surgery. Men may shave face and neck. Do not bring valuables to the hospital.    [facility] for any belongings or valuables.   Contacts, dentures/partials or body piercings may not be worn into surgery. Bring a case for your contacts, glasses or hearing aids, a denture cup will be supplied.   Leave your suitcase in the car. After surgery it may be brought to your room. For patients admitted to  the hospital, discharge time is determined by your treatment team.   Patients discharged the day of surgery will not be allowed to drive home.    Please read over the following fact sheets that you were given:    MRSA Information    __X__ Take these medicines the morning of surgery with A SIP OF WATER:     1. None   __X__ Use CHG Soap wipes as directed    __X__ Stop Anti-inflammatories 7 days before surgery such as Advil, Ibuprofen, Motrin, BC or Goodies Powder, Naprosyn, Naproxen, Aleve, Aspirin, Meloxicam. Last dose of Ibuprofen or Aspirin on Thursday 06/15/18. May take Tylenol if needed for pain or discomfort.    .Marland Kitchen

## 2018-05-12 LAB — URINE CULTURE: CULTURE: NO GROWTH

## 2018-05-13 DIAGNOSIS — Z01818 Encounter for other preprocedural examination: Secondary | ICD-10-CM | POA: Diagnosis not present

## 2018-05-23 MED ORDER — TRANEXAMIC ACID-NACL 1000-0.7 MG/100ML-% IV SOLN
1000.0000 mg | INTRAVENOUS | Status: AC
  Administered 2018-05-24: 1000 mg via INTRAVENOUS
  Filled 2018-05-23: qty 100

## 2018-05-23 MED ORDER — CEFAZOLIN SODIUM-DEXTROSE 2-4 GM/100ML-% IV SOLN
2.0000 g | INTRAVENOUS | Status: AC
  Administered 2018-05-24: 2 g via INTRAVENOUS

## 2018-05-24 ENCOUNTER — Inpatient Hospital Stay: Payer: 59 | Admitting: Anesthesiology

## 2018-05-24 ENCOUNTER — Encounter
Admission: RE | Disposition: A | Discharge status: Home / Self Care | Payer: Self-pay | Source: Ambulatory Visit | Attending: Orthopedic Surgery

## 2018-05-24 ENCOUNTER — Other Ambulatory Visit: Payer: Self-pay

## 2018-05-24 ENCOUNTER — Inpatient Hospital Stay: Payer: 59

## 2018-05-24 ENCOUNTER — Inpatient Hospital Stay
Admission: RE | Admit: 2018-05-24 | Discharge: 2018-05-25 | DRG: 470 | Disposition: A | Discharge status: Home / Self Care | Payer: 59 | Source: Ambulatory Visit | Attending: Orthopedic Surgery | Admitting: Orthopedic Surgery

## 2018-05-24 DIAGNOSIS — M1612 Unilateral primary osteoarthritis, left hip: Principal | ICD-10-CM | POA: Diagnosis present

## 2018-05-24 DIAGNOSIS — E785 Hyperlipidemia, unspecified: Secondary | ICD-10-CM | POA: Diagnosis present

## 2018-05-24 DIAGNOSIS — Z471 Aftercare following joint replacement surgery: Secondary | ICD-10-CM | POA: Diagnosis not present

## 2018-05-24 DIAGNOSIS — Z79899 Other long term (current) drug therapy: Secondary | ICD-10-CM | POA: Diagnosis not present

## 2018-05-24 DIAGNOSIS — Z419 Encounter for procedure for purposes other than remedying health state, unspecified: Secondary | ICD-10-CM

## 2018-05-24 DIAGNOSIS — Z09 Encounter for follow-up examination after completed treatment for conditions other than malignant neoplasm: Secondary | ICD-10-CM

## 2018-05-24 DIAGNOSIS — Z96642 Presence of left artificial hip joint: Secondary | ICD-10-CM | POA: Diagnosis not present

## 2018-05-24 HISTORY — PX: TOTAL HIP ARTHROPLASTY: SHX124

## 2018-05-24 SURGERY — ARTHROPLASTY, HIP, TOTAL, ANTERIOR APPROACH
Anesthesia: Spinal | Laterality: Left

## 2018-05-24 MED ORDER — SODIUM CHLORIDE 0.9 % IR SOLN
Status: DC | PRN
  Administered 2018-05-24: 100 mL
  Administered 2018-05-24: 250 mL

## 2018-05-24 MED ORDER — SODIUM CHLORIDE 0.9 % IV SOLN
INTRAVENOUS | Status: DC | PRN
  Administered 2018-05-24: 30 ug/min via INTRAVENOUS

## 2018-05-24 MED ORDER — DOCUSATE SODIUM 100 MG PO CAPS
100.0000 mg | ORAL_CAPSULE | Freq: Two times a day (BID) | ORAL | Status: DC
  Administered 2018-05-25: 100 mg via ORAL
  Filled 2018-05-24 (×2): qty 1

## 2018-05-24 MED ORDER — HYDROCODONE-ACETAMINOPHEN 7.5-325 MG PO TABS: 1.0000 | ORAL_TABLET | ORAL | Status: DC | PRN

## 2018-05-24 MED ORDER — ACETAMINOPHEN 500 MG PO TABS
1000.0000 mg | ORAL_TABLET | Freq: Once | ORAL | Status: AC
  Administered 2018-05-24: 1000 mg via ORAL

## 2018-05-24 MED ORDER — CEFAZOLIN SODIUM-DEXTROSE 2-4 GM/100ML-% IV SOLN
INTRAVENOUS | Status: AC
  Filled 2018-05-24: qty 100

## 2018-05-24 MED ORDER — ROSUVASTATIN CALCIUM 10 MG PO TABS
10.0000 mg | ORAL_TABLET | Freq: Every evening | ORAL | Status: DC
Start: 2018-05-24 — End: 2018-05-25
  Administered 2018-05-24: 10 mg via ORAL
  Filled 2018-05-24: qty 1

## 2018-05-24 MED ORDER — ONDANSETRON HCL 4 MG PO TABS: 4.0000 mg | ORAL_TABLET | Freq: Four times a day (QID) | ORAL | Status: DC | PRN

## 2018-05-24 MED ORDER — MENTHOL 3 MG MT LOZG
1.0000 | LOZENGE | OROMUCOSAL | Status: DC | PRN
  Filled 2018-05-24: qty 9

## 2018-05-24 MED ORDER — PROPOFOL 10 MG/ML IV BOLUS
INTRAVENOUS | Status: DC | PRN
  Administered 2018-05-24: 10 mg via INTRAVENOUS
  Administered 2018-05-24: 50 mg via INTRAVENOUS
  Administered 2018-05-24 (×2): 10 mg via INTRAVENOUS
  Administered 2018-05-24: 20 mg via INTRAVENOUS

## 2018-05-24 MED ORDER — PROPOFOL 500 MG/50ML IV EMUL
INTRAVENOUS | Status: DC | PRN
  Administered 2018-05-24: 90 ug/kg/min via INTRAVENOUS

## 2018-05-24 MED ORDER — MIDAZOLAM HCL 5 MG/5ML IJ SOLN
INTRAMUSCULAR | Status: DC | PRN
  Administered 2018-05-24: 2 mg via INTRAVENOUS

## 2018-05-24 MED ORDER — FAMOTIDINE 20 MG PO TABS
ORAL_TABLET | ORAL | Status: AC
  Administered 2018-05-24: 20 mg
  Filled 2018-05-24: qty 1

## 2018-05-24 MED ORDER — LACTATED RINGERS IV BOLUS
500.0000 mL | Freq: Once | INTRAVENOUS | Status: AC
  Administered 2018-05-24: 500 mL via INTRAVENOUS

## 2018-05-24 MED ORDER — GABAPENTIN 300 MG PO CAPS
300.0000 mg | ORAL_CAPSULE | Freq: Three times a day (TID) | ORAL | Status: DC
  Administered 2018-05-24 – 2018-05-25 (×3): 300 mg via ORAL
  Filled 2018-05-24 (×3): qty 1

## 2018-05-24 MED ORDER — ACETAMINOPHEN 500 MG PO TABS
500.0000 mg | ORAL_TABLET | Freq: Four times a day (QID) | ORAL | Status: AC
  Administered 2018-05-24 – 2018-05-25 (×4): 500 mg via ORAL
  Filled 2018-05-24 (×5): qty 1

## 2018-05-24 MED ORDER — HYDROCODONE-ACETAMINOPHEN 5-325 MG PO TABS: 1.0000 | ORAL_TABLET | ORAL | Status: DC | PRN

## 2018-05-24 MED ORDER — MIDAZOLAM HCL 2 MG/2ML IJ SOLN
INTRAMUSCULAR | Status: AC
  Filled 2018-05-24: qty 2

## 2018-05-24 MED ORDER — CEFAZOLIN SODIUM-DEXTROSE 2-4 GM/100ML-% IV SOLN
2.0000 g | Freq: Four times a day (QID) | INTRAVENOUS | Status: AC
  Administered 2018-05-24 (×2): 2 g via INTRAVENOUS
  Filled 2018-05-24 (×2): qty 100

## 2018-05-24 MED ORDER — BUPIVACAINE-EPINEPHRINE (PF) 0.25% -1:200000 IJ SOLN
INTRAMUSCULAR | Status: DC | PRN
  Administered 2018-05-24: 20 mL via PERINEURAL

## 2018-05-24 MED ORDER — MAGNESIUM CITRATE PO SOLN
1.0000 | Freq: Once | ORAL | Status: DC | PRN
  Filled 2018-05-24: qty 296

## 2018-05-24 MED ORDER — PROPOFOL 500 MG/50ML IV EMUL
INTRAVENOUS | Status: AC
  Filled 2018-05-24: qty 50

## 2018-05-24 MED ORDER — LACTATED RINGERS IV SOLN: INTRAVENOUS | Status: DC

## 2018-05-24 MED ORDER — BUPIVACAINE HCL (PF) 0.25 % IJ SOLN
INTRAMUSCULAR | Status: AC
  Filled 2018-05-24: qty 30

## 2018-05-24 MED ORDER — KETOROLAC TROMETHAMINE 15 MG/ML IJ SOLN
15.0000 mg | Freq: Four times a day (QID) | INTRAMUSCULAR | Status: AC
  Administered 2018-05-24 – 2018-05-25 (×4): 15 mg via INTRAVENOUS
  Filled 2018-05-24 (×5): qty 1

## 2018-05-24 MED ORDER — GABAPENTIN 300 MG PO CAPS
300.0000 mg | ORAL_CAPSULE | Freq: Once | ORAL | Status: AC
  Administered 2018-05-24: 300 mg via ORAL

## 2018-05-24 MED ORDER — MORPHINE SULFATE (PF) 4 MG/ML IV SOLN: 0.5000 mg | INTRAVENOUS | Status: DC | PRN

## 2018-05-24 MED ORDER — BACITRACIN 50000 UNITS IM SOLR
INTRAMUSCULAR | Status: AC
  Filled 2018-05-24: qty 2

## 2018-05-24 MED ORDER — PROMETHAZINE HCL 25 MG/ML IJ SOLN: 6.2500 mg | INTRAMUSCULAR | Status: DC | PRN

## 2018-05-24 MED ORDER — BUPIVACAINE HCL (PF) 0.5 % IJ SOLN
INTRAMUSCULAR | Status: DC | PRN
  Administered 2018-05-24: 3 mL

## 2018-05-24 MED ORDER — METOCLOPRAMIDE HCL 10 MG PO TABS: 5.0000 mg | ORAL_TABLET | Freq: Three times a day (TID) | ORAL | Status: DC | PRN

## 2018-05-24 MED ORDER — GABAPENTIN 300 MG PO CAPS
ORAL_CAPSULE | ORAL | Status: AC
  Administered 2018-05-24: 300 mg via ORAL
  Filled 2018-05-24: qty 1

## 2018-05-24 MED ORDER — MEPERIDINE HCL 50 MG/ML IJ SOLN: 6.2500 mg | INTRAMUSCULAR | Status: DC | PRN

## 2018-05-24 MED ORDER — BUPIVACAINE-EPINEPHRINE (PF) 0.25% -1:200000 IJ SOLN
INTRAMUSCULAR | Status: AC
  Filled 2018-05-24: qty 30

## 2018-05-24 MED ORDER — OXYCODONE HCL 5 MG/5ML PO SOLN: 5.0000 mg | Freq: Once | ORAL | Status: DC | PRN

## 2018-05-24 MED ORDER — ACETAMINOPHEN 325 MG PO TABS: 325.0000 mg | ORAL_TABLET | Freq: Four times a day (QID) | ORAL | Status: DC | PRN

## 2018-05-24 MED ORDER — OXYCODONE HCL 5 MG PO TABS: 5.0000 mg | ORAL_TABLET | Freq: Once | ORAL | Status: DC | PRN

## 2018-05-24 MED ORDER — ASPIRIN 81 MG PO CHEW
81.0000 mg | CHEWABLE_TABLET | Freq: Two times a day (BID) | ORAL | Status: DC
  Administered 2018-05-24 – 2018-05-25 (×2): 81 mg via ORAL
  Filled 2018-05-24 (×2): qty 1

## 2018-05-24 MED ORDER — BISACODYL 10 MG RE SUPP: 10.0000 mg | Freq: Every day | RECTAL | Status: DC | PRN

## 2018-05-24 MED ORDER — MAGNESIUM HYDROXIDE 400 MG/5ML PO SUSP: 30.0000 mL | Freq: Every day | ORAL | Status: DC | PRN

## 2018-05-24 MED ORDER — PROPOFOL 500 MG/50ML IV EMUL: INTRAVENOUS | Status: DC | PRN

## 2018-05-24 MED ORDER — LACTATED RINGERS IV SOLN
INTRAVENOUS | Status: DC
  Administered 2018-05-24: 07:00:00 via INTRAVENOUS

## 2018-05-24 MED ORDER — METOCLOPRAMIDE HCL 5 MG/ML IJ SOLN: 5.0000 mg | Freq: Three times a day (TID) | INTRAMUSCULAR | Status: DC | PRN

## 2018-05-24 MED ORDER — ONDANSETRON HCL 4 MG/2ML IJ SOLN: 4.0000 mg | Freq: Four times a day (QID) | INTRAMUSCULAR | Status: DC | PRN

## 2018-05-24 MED ORDER — PHENOL 1.4 % MT LIQD
1.0000 | OROMUCOSAL | Status: DC | PRN
  Filled 2018-05-24: qty 177

## 2018-05-24 MED ORDER — CHLORHEXIDINE GLUCONATE 4 % EX LIQD: 60.0000 mL | Freq: Once | CUTANEOUS | Status: DC

## 2018-05-24 MED ORDER — ACETAMINOPHEN 500 MG PO TABS
ORAL_TABLET | ORAL | Status: AC
  Administered 2018-05-24: 1000 mg via ORAL
  Filled 2018-05-24: qty 2

## 2018-05-24 MED ORDER — FENTANYL CITRATE (PF) 100 MCG/2ML IJ SOLN: 25.0000 ug | INTRAMUSCULAR | Status: DC | PRN

## 2018-05-24 SURGICAL SUPPLY — 48 items
BLADE SAGITTAL WIDE XTHICK NO (BLADE) ×2 IMPLANT
BRUSH SCRUB EZ  4% CHG (MISCELLANEOUS) ×2
BRUSH SCRUB EZ 4% CHG (MISCELLANEOUS) ×2 IMPLANT
CHLORAPREP W/TINT 26ML (MISCELLANEOUS) ×2 IMPLANT
COVER HOLE (Hips) ×2 IMPLANT
COVER WAND RF STERILE (DRAPES) ×2 IMPLANT
DRAPE C-ARM 42X72 X-RAY (DRAPES) ×2 IMPLANT
DRAPE SHEET LG 3/4 BI-LAMINATE (DRAPES) ×2 IMPLANT
DRAPE STERI IOBAN 125X83 (DRAPES) IMPLANT
DRSG AQUACEL AG ADV 3.5X10 (GAUZE/BANDAGES/DRESSINGS) IMPLANT
DRSG AQUACEL AG ADV 3.5X14 (GAUZE/BANDAGES/DRESSINGS) IMPLANT
ELECT BLADE 6.5 EXT (BLADE) ×2 IMPLANT
ELECT REM PT RETURN 9FT ADLT (ELECTROSURGICAL) ×2
ELECTRODE REM PT RTRN 9FT ADLT (ELECTROSURGICAL) ×1 IMPLANT
GAUZE PETRO XEROFOAM 1X8 (MISCELLANEOUS) IMPLANT
GLOVE INDICATOR 8.0 STRL GRN (GLOVE) ×2 IMPLANT
GLOVE SURG ORTHO 8.0 STRL STRW (GLOVE) ×4 IMPLANT
GOWN STRL REUS W/ TWL LRG LVL3 (GOWN DISPOSABLE) ×1 IMPLANT
GOWN STRL REUS W/ TWL XL LVL3 (GOWN DISPOSABLE) ×1 IMPLANT
GOWN STRL REUS W/TWL LRG LVL3 (GOWN DISPOSABLE) ×1
GOWN STRL REUS W/TWL XL LVL3 (GOWN DISPOSABLE) ×1
HEAD FEMORAL TAPER 36MM P0 (Head) ×2 IMPLANT
HOOD PEEL AWAY FLYTE STAYCOOL (MISCELLANEOUS) ×6 IMPLANT
IV NS 1000ML (IV SOLUTION) ×1
IV NS 1000ML BAXH (IV SOLUTION) ×1 IMPLANT
KIT PATIENT CARE HANA TABLE (KITS) ×2 IMPLANT
KIT TURNOVER CYSTO (KITS) ×2 IMPLANT
LINER ACETABULAR 36X56 OD (Liner) ×2 IMPLANT
MAT ABSORB  FLUID 56X50 GRAY (MISCELLANEOUS) ×1
MAT ABSORB FLUID 56X50 GRAY (MISCELLANEOUS) ×1 IMPLANT
NDL SAFETY ECLIPSE 18X1.5 (NEEDLE) ×2 IMPLANT
NEEDLE HYPO 18GX1.5 SHARP (NEEDLE) ×2
NEEDLE HYPO 22GX1.5 SAFETY (NEEDLE) ×2 IMPLANT
NEEDLE SPNL 20GX3.5 QUINCKE YW (NEEDLE) ×2 IMPLANT
PACK HIP PROSTHESIS (MISCELLANEOUS) ×2 IMPLANT
PADDING CAST BLEND 4X4 NS (MISCELLANEOUS) ×4 IMPLANT
PILLOW ABDUCTION MEDIUM (MISCELLANEOUS) ×2 IMPLANT
PULSAVAC PLUS IRRIG FAN TIP (DISPOSABLE) ×2
SCREW 6.5X25MM (Screw) ×2 IMPLANT
SHELL USE W/LINR OD 56MM (Shell) ×2 IMPLANT
STAPLER SKIN PROX 35W (STAPLE) ×2 IMPLANT
STEM STD COLLAR SZ7 POLARSTEM (Stem) ×2 IMPLANT
SUT BONE WAX W31G (SUTURE) ×2 IMPLANT
SUT DVC 2 QUILL PDO  T11 36X36 (SUTURE) ×1
SUT DVC 2 QUILL PDO T11 36X36 (SUTURE) ×1 IMPLANT
SUT VIC AB 2-0 CT1 18 (SUTURE) ×2 IMPLANT
SYR 20CC LL (SYRINGE) ×2 IMPLANT
TIP FAN IRRIG PULSAVAC PLUS (DISPOSABLE) ×1 IMPLANT

## 2018-05-24 NOTE — Transfer of Care (Signed)
Immediate Anesthesia Transfer of Care Note  Patient: Adam HuskyDavid J [name]  Procedure(s) Performed: TOTAL HIP ARTHROPLASTY ANTERIOR APPROACH (Left )  Patient Location: PACU  Anesthesia Type:Spinal  Level of Consciousness: sedated  Airway & Oxygen Therapy: Patient Spontanous Breathing and Patient connected to face mask oxygen  Post-op Assessment: Report given to RN and Post -op Vital signs reviewed and stable  Post vital signs: Reviewed and stable  Last Vitals:  Vitals Value Taken Time  BP 95/61 05/24/2018  9:44 AM  Temp 36.6 C 05/24/2018  9:44 AM  Pulse 52 05/24/2018  9:49 AM  Resp 8 05/24/2018  9:49 AM  SpO2 100 % 05/24/2018  9:49 AM  Vitals shown include unvalidated device data.  Last Pain:  Vitals:   05/24/18 0641  TempSrc: Tympanic  PainSc: 3          Complications: No apparent anesthesia complications

## 2018-05-24 NOTE — Evaluation (Signed)
Physical Therapy Evaluation Patient Details Name: Adam HuskyDavid J [name] MRN: [id number] DOB: 09/15/1960 Today's Date: 05/24/2018   History of Present Illness  Pt is a 57 y.o. male s/p L THR anterior approach secondary OA 05/24/18.  PMH includes R THA anterior approach 07/30/17.  Clinical Impression  Pt seen on POD #0.  Prior to hospital admission, pt was independent with functional mobility and working.  Currently pt is modified independent semi-supine to sit; SBA with transfers; and SBA ambulating 200 feet with RW.  Pain 2/10 L hip beginning and end of session.  Pt reporting concerns regarding L LE feeling longer than R LE (pt's primary nurse not available so charge nurse notified).  Overall pt steady with functional mobility and would benefit from trial of cane next session (pt has own cane in room).  Pt would benefit from skilled PT to address noted impairments and functional limitations (see below for any additional details).  Upon hospital discharge, recommend pt discharge with OP PT.    Follow Up Recommendations Outpatient PT    Equipment Recommendations  Cane    Recommendations for Other Services       Precautions / Restrictions Precautions Precautions: Fall;Anterior Hip (reviewed with pt and pt's wife) Precaution Booklet Issued: Yes (comment) Precaution Comments: Avoid SLR (pt reports precaution from MD) Restrictions Weight Bearing Restrictions: Yes LLE Weight Bearing: Weight bearing as tolerated      Mobility  Bed Mobility Overal bed mobility: Modified Independent             General bed mobility comments: Semi-supine to sit with HOB elevated.  Transfers Overall transfer level: Needs assistance Equipment used: None Transfers: Sit to/from Stand Sit to Stand: Supervision         General transfer comment: steady strong transfers  Ambulation/Gait Ambulation/Gait assistance: Supervision Gait Distance (Feet): 200 Feet Assistive device: Rolling walker (2 wheeled)    Gait velocity: decreased   General Gait Details: almost step through gait pattern; steady with RW (minimal WB'ing through walker with UE's); very minimal decreased stance time L LE; steady  Stairs            Wheelchair Mobility    Modified Rankin (Stroke Patients Only)       Balance Overall balance assessment: Modified Independent Sitting-balance support: No upper extremity supported Sitting balance-Leahy Scale: Normal Sitting balance - Comments: steady sitting reaching outside BOS   Standing balance support: No upper extremity supported Standing balance-Leahy Scale: Good Standing balance comment: steady standing reaching within BOS                             Pertinent Vitals/Pain Pain Assessment: 0-10 Pain Score: 2  Pain Location: L hip Pain Descriptors / Indicators: Sore Pain Intervention(s): Limited activity within patient's tolerance;Monitored during session;Premedicated before session;Repositioned;Ice applied    Home Living Family/patient expects to be discharged to:: Private residence Living Arrangements: Spouse/significant other Available Help at Discharge: Family Type of Home: Apartment Home Access: Stairs to enter Entrance Stairs-Rails: Left Entrance Stairs-Number of Steps: flight Home Layout: One level Home Equipment: Cane - single point      Prior Function Level of Independence: Independent         Comments: Active job.     Hand Dominance        Extremity/Trunk Assessment   Upper Extremity Assessment Upper Extremity Assessment: Overall WFL for tasks assessed    Lower Extremity Assessment Lower Extremity Assessment: RLE deficits/detail;LLE deficits/detail RLE Deficits /  Details: strength and ROM WFL LLE Deficits / Details: hip flexion, knee flexion/extension, and DF at least 3/5 AROM    Cervical / Trunk Assessment Cervical / Trunk Assessment: Normal  Communication   Communication: No difficulties  Cognition  Arousal/Alertness: Awake/alert Behavior During Therapy: WFL for tasks assessed/performed Overall Cognitive Status: Within Functional Limits for tasks assessed                                        General Comments General comments (skin integrity, edema, etc.): L hip dressing in place.  Nursing cleared pt for participation in physical therapy.  Pt agreeable to PT session.  Pt's wife present during session and assisted pt when toileting in bathroom.  Sensation returned B LE's    Exercises  HEP handout provided.   Assessment/Plan    PT Assessment Patient needs continued PT services  PT Problem List Decreased strength;Decreased balance;Decreased mobility;Pain       PT Treatment Interventions DME instruction;Gait training;Stair training;Functional mobility training;Balance training;Therapeutic exercise;Therapeutic activities;Patient/family education    PT Goals (Current goals can be found in the Care Plan section)  Acute Rehab PT Goals Patient Stated Goal: to go home PT Goal Formulation: With patient/family Time For Goal Achievement: 06/07/18 Potential to Achieve Goals: Good    Frequency BID   Barriers to discharge        Co-evaluation               AM-PAC PT "6 Clicks" Mobility  Outcome Measure Help needed turning from your back to your side while in a flat bed without using bedrails?: None Help needed moving from lying on your back to sitting on the side of a flat bed without using bedrails?: None Help needed moving to and from a bed to a chair (including a wheelchair)?: None Help needed standing up from a chair using your arms (e.g., wheelchair or bedside chair)?: None Help needed to walk in hospital room?: None Help needed climbing 3-5 steps with a railing? : A Little 6 Click Score: 23    End of Session Equipment Utilized During Treatment: Gait belt Activity Tolerance: Patient tolerated treatment well Patient left: in chair;with call bell/phone  within reach;with chair alarm set;with family/visitor present;Other (comment)(pt declined SCD's until he got back to bed) Nurse Communication: Mobility status;Precautions;Weight bearing status;Other (comment)(Pt's primary nurse unavailable but notified charge nurse of pt's pain level, mobility, pt s/p toileting needing output measured, and concerns of L LE being longer than R LE) PT Visit Diagnosis: Other abnormalities of gait and mobility (R26.89);Difficulty in walking, not elsewhere classified (R26.2);Pain Pain - Right/Left: Left Pain - part of body: Hip    Time: 1530-1607 PT Time Calculation (min) (ACUTE ONLY): 37 min   Charges:   PT Evaluation $PT Eval Low Complexity: 1 Low PT Treatments $Therapeutic Activity: 8-22 mins       Hendricks Limes, PT 05/24/18, 4:49 PM [phone]

## 2018-05-24 NOTE — Plan of Care (Signed)
  Problem: Education: Goal: Knowledge of General Education information will improve Description Including pain rating scale, medication(s)/side effects and non-pharmacologic comfort measures Outcome: Progressing   Problem: Health Behavior/Discharge Planning: Goal: Ability to manage health-related needs will improve Outcome: Progressing   Problem: Clinical Measurements: Goal: Ability to maintain clinical measurements within normal limits will improve Outcome: Progressing Goal: Will remain free from infection Outcome: Progressing Goal: Diagnostic test results will improve Outcome: Progressing Goal: Respiratory complications will improve Outcome: Progressing Goal: Cardiovascular complication will be avoided Outcome: Progressing   Problem: Activity: Goal: Risk for activity intolerance will decrease Outcome: Progressing   Problem: Nutrition: Goal: Adequate nutrition will be maintained Outcome: Progressing   Problem: Coping: Goal: Level of anxiety will decrease Outcome: Progressing   Problem: Elimination: Goal: Will not experience complications related to bowel motility Outcome: Progressing Goal: Will not experience complications related to urinary retention Outcome: Progressing   Problem: Pain Managment: Goal: General experience of comfort will improve Outcome: Progressing   Problem: Safety: Goal: Ability to remain free from injury will improve Outcome: Progressing   Problem: Skin Integrity: Goal: Risk for impaired skin integrity will decrease Outcome: Progressing   Problem: Education: Goal: Knowledge of the prescribed therapeutic regimen will improve Outcome: Progressing Goal: Understanding of discharge needs will improve Outcome: Progressing Goal: Individualized Educational Video(s) Outcome: Progressing   Problem: Activity: Goal: Ability to avoid complications of mobility impairment will improve Outcome: Progressing Goal: Ability to tolerate increased  activity will improve Outcome: Progressing   Problem: Clinical Measurements: Goal: Postoperative complications will be avoided or minimized Outcome: Progressing   Problem: Pain Management: Goal: Pain level will decrease with appropriate interventions Outcome: Progressing   Problem: Skin Integrity: Goal: Will show signs of wound healing Outcome: Progressing   Problem: Education: Goal: Knowledge of General Education information will improve Description Including pain rating scale, medication(s)/side effects and non-pharmacologic comfort measures Outcome: Progressing   Problem: Health Behavior/Discharge Planning: Goal: Ability to manage health-related needs will improve Outcome: Progressing   Problem: Clinical Measurements: Goal: Ability to maintain clinical measurements within normal limits will improve Outcome: Progressing Goal: Will remain free from infection Outcome: Progressing Goal: Diagnostic test results will improve Outcome: Progressing Goal: Respiratory complications will improve Outcome: Progressing Goal: Cardiovascular complication will be avoided Outcome: Progressing   Problem: Activity: Goal: Risk for activity intolerance will decrease Outcome: Progressing   Problem: Nutrition: Goal: Adequate nutrition will be maintained Outcome: Progressing   Problem: Coping: Goal: Level of anxiety will decrease Outcome: Progressing   Problem: Elimination: Goal: Will not experience complications related to bowel motility Outcome: Progressing Goal: Will not experience complications related to urinary retention Outcome: Progressing   Problem: Pain Managment: Goal: General experience of comfort will improve Outcome: Progressing   Problem: Safety: Goal: Ability to remain free from injury will improve Outcome: Progressing   Problem: Skin Integrity: Goal: Risk for impaired skin integrity will decrease Outcome: Progressing   Problem: Education: Goal: Knowledge  of the prescribed therapeutic regimen will improve Outcome: Progressing Goal: Understanding of discharge needs will improve Outcome: Progressing Goal: Individualized Educational Video(s) Outcome: Progressing   Problem: Activity: Goal: Ability to avoid complications of mobility impairment will improve Outcome: Progressing Goal: Ability to tolerate increased activity will improve Outcome: Progressing   Problem: Clinical Measurements: Goal: Postoperative complications will be avoided or minimized Outcome: Progressing   Problem: Pain Management: Goal: Pain level will decrease with appropriate interventions Outcome: Progressing   Problem: Skin Integrity: Goal: Will show signs of wound healing Outcome: Progressing   

## 2018-05-24 NOTE — H&P (Signed)
The patient has been re-examined, and the chart reviewed, and there have been no interval changes to the documented history and physical.  Plan a left total hip today.  Anesthesia is consulted regarding a peripheral nerve block for post-operative pain.  The risks, benefits, and alternatives have been discussed at length, and the patient is willing to proceed.

## 2018-05-24 NOTE — Care Management (Signed)
RNCM spoke with patient regarding DME and follow up with outpatient PT.  He plans to use a cane at discharge. He does not have any DME. He is not aware of any outpatient follow appointments.  CM team will need to follow PT evaluation and MD recommendations. He plans to return to home with his wife at discharge.

## 2018-05-24 NOTE — [place] FL2 (Signed)
[place] MEDICAID FL2 LEVEL OF CARE SCREENING TOOL     IDENTIFICATION  Patient Name: Adam HuskyDavid J Akopyan Birthdate: 1961-04-09 Sex: male Admission Date (Current Location): 05/24/2018  [place]   Facility and Address:  [facility], [address], [place], [place] [address]      Provider Number: [id number]  Attending Physician Name and Address:  Lyndle HerrlichBowers, James R, MD  Relative Name and Phone Number:       Current Level of Care: Hospital Recommended Level of Care: Skilled Nursing Facility Prior Approval Number:    Date Approved/Denied:   PASRR Number: [id number])  Discharge Plan: SNF    Current Diagnoses: Patient Active Problem List   Diagnosis Date Noted  . Osteoarthritis of left hip 05/24/2018  . Status post total hip replacement, right 07/30/2017  . H/O colonoscopy 06/25/2015  . Primary osteoarthritis of both hips 06/25/2015  . Annual physical exam 05/27/2014  . Bilateral hip pain 05/27/2014  . Hyperlipidemia LDL goal <130 05/27/2014  . Depression 01/30/2014    Orientation RESPIRATION BLADDER Height & Weight     Self, Time, Place, Situation  Normal Continent Weight: 185 lb (83.9 kg) Height:  5\' 10"  (177.8 cm)  BEHAVIORAL SYMPTOMS/MOOD NEUROLOGICAL BOWEL NUTRITION STATUS      Continent Diet(Diet: Regular )  AMBULATORY STATUS COMMUNICATION OF NEEDS Skin   Extensive Assist Verbally Surgical wounds(Incision: Left Hip. )                       Personal Care Assistance Level of Assistance  Bathing, Feeding, Dressing Bathing Assistance: Limited assistance Feeding assistance: Independent Dressing Assistance: Limited assistance     Functional Limitations Info  Sight, Hearing, Speech Sight Info: Adequate Hearing Info: Adequate Speech Info: Adequate    SPECIAL CARE FACTORS FREQUENCY  PT (By licensed PT), OT (By licensed OT)     PT Frequency: (5) OT Frequency: (5)            Contractures       Additional Factors Info  Code Status, Allergies Code Status Info: (Full Code. ) Allergies Info: (No Known Allergies. )           Current Medications (05/24/2018):  This is the current hospital active medication list Current Facility-Administered Medications  Medication Dose Route Frequency Provider Last Rate Last Dose  . [START ON 05/25/2018] acetaminophen (TYLENOL) tablet 325-650 mg  325-650 mg Oral Q6H PRN Lyndle HerrlichBowers, James R, MD      . acetaminophen (TYLENOL) tablet 500 mg  500 mg Oral Q6H Lyndle HerrlichBowers, James R, MD   500 mg at 05/24/18 1220  . aspirin chewable tablet 81 mg  81 mg Oral BID Lyndle HerrlichBowers, James R, MD      . bisacodyl (DULCOLAX) suppository 10 mg  10 mg Rectal Daily PRN Lyndle HerrlichBowers, James R, MD      . ceFAZolin (ANCEF) IVPB 2g/100 mL premix  2 g Intravenous Q6H Lyndle HerrlichBowers, James R, MD 200 mL/hr at 05/24/18 1217 2 g at 05/24/18 1217  . docusate sodium (COLACE) capsule 100 mg  100 mg Oral BID Lyndle HerrlichBowers, James R, MD      . gabapentin (NEURONTIN) capsule 300 mg  300 mg Oral TID Lyndle HerrlichBowers, James R, MD      . HYDROcodone-acetaminophen [facility](NORCO) 7.5-325 MG per tablet 1-2 tablet  1-2 tablet Oral Q4H PRN Lyndle HerrlichBowers, James R, MD      . HYDROcodone-acetaminophen (NORCO/VICODIN) 5-325 MG per tablet 1-2 tablet  1-2 tablet Oral Q4H PRN  Lyndle Herrlich, MD      . ketorolac (TORADOL) 15 MG/ML injection 15 mg  15 mg Intravenous Q6H Lyndle Herrlich, MD   15 mg at 05/24/18 1217  . lactated ringers infusion   Intravenous Continuous Lyndle Herrlich, MD 75 mL/hr at 05/24/18 1100    . magnesium citrate solution 1 Bottle  1 Bottle Oral Once PRN Lyndle Herrlich, MD      . magnesium hydroxide (MILK OF MAGNESIA) suspension 30 mL  30 mL Oral Daily PRN Lyndle Herrlich, MD      . menthol-cetylpyridinium (CEPACOL) lozenge 3 mg  1 lozenge Oral PRN Lyndle Herrlich, MD       Or  . phenol (CHLORASEPTIC) mouth spray 1 spray  1 spray Mouth/Throat PRN Lyndle Herrlich, MD      . metoCLOPramide (REGLAN) tablet 5-10 mg  5-10 mg Oral Q8H PRN  Lyndle Herrlich, MD       Or  . metoCLOPramide (REGLAN) injection 5-10 mg  5-10 mg Intravenous Q8H PRN Lyndle Herrlich, MD      . morphine 4 MG/ML injection 0.52-1 mg  0.52-1 mg Intravenous Q2H PRN Lyndle Herrlich, MD      . ondansetron [facility]) tablet 4 mg  4 mg Oral Q6H PRN Lyndle Herrlich, MD       Or  . ondansetron [facility]) injection 4 mg  4 mg Intravenous Q6H PRN Lyndle Herrlich, MD      . rosuvastatin (CRESTOR) tablet 10 mg  10 mg Oral QPM Lyndle Herrlich, MD         Discharge Medications: Please see discharge summary for a list of discharge medications.  Relevant Imaging Results:  Relevant Lab Results:   Additional Information (SSN: [id number])  Lyrick Worland, Darleen Crocker, LCSW

## 2018-05-24 NOTE — Op Note (Signed)
05/24/2018  9:39 AM  PATIENT:  Adam [name]   MRN: [id number]  PRE-OPERATIVE DIAGNOSIS:  Osteoarthritis left hip   POST-OPERATIVE DIAGNOSIS: Same  Procedure: Left Total Hip Replacement  Surgeon: Dola ArgyleJames R. Odis LusterBowers, MD   Assist: Altamese CabalMaurice Jones, PA-C  Anesthesia: Spinal   EBL: 100 mL   Specimens: None   Drains: None   Components used: A size 7 Polarstem [employer], R3 size 56 mm shell, and a 36 by +0 mm head    Description of the procedure in detail: After informed consent was obtained and the appropriate extremity marked in the pre-operative holding area, the patient was taken to the operating room and placed in the supine position on the fracture table. All pressure points were well padded and bilateral lower extremities were place in traction spars. The hip was prepped and draped in standard sterile fashion. A spinal anesthetic had been delivered by the anesthesia team. The skin and subcutaneous tissues were injected with a mixture of Marcaine with epinephrine for post-operative pain. A longitudinal incision approximately 10 cm in length was carried out from the anterior superior iliac spine to the greater trochanter. The tensor fascia was divided and blunt dissection was taken down to the level of the joint capsule. The lateral circumflex vessels were cauterized. Deep retractors were placed and a portion of the anterior capsule was excised. Using fluoroscopy the neck cut was planned and carried out with a sagittal saw. The head was passed from the field with use of a corkscrew and hip skid. Deep retractors were placed along the acetabulum and the degenerative labrum and large osteophytes were removed with a Rongeur. The cup was sequentially reamed to a size 56 mm. The wound was irrigated and using fluoroscopy the size 56 mm cup was impacted in to anatomic position. A single screw was placed followed by a threaded hole cover. The final liner was impacted in to position. Attention was  then turned to the proximal femur. The leg was placed in extension and external rotation. The canal was opened and sequentially broached to a size 7. The trial components were placed and the hip relocated. The components were found to be in good position using fluoroscopy. The hip was dislocated and the trial components removed. The final components were impacted in to position and the hip relocated. The final components were again check with fluoroscopy and found to be in good position. Hemostasis was achieved with electrocautery. The deep capsule was injected with Marcaine and epinephrine. The wound was irrigated with bacitracin laced normal saline and the tensor fascia closed with #2 Quill suture. The subcutaneous tissues were closed with 2-0 vicryl and staples for the skin. A sterile dressing was applied and an abduction pillow. Patient tolerated the procedure well and there were no apparent complication. Patient was taken to the recovery room in good condition.   Cassell SmilesJames Clorine Swing, MD

## 2018-05-24 NOTE — Anesthesia Procedure Notes (Signed)
Spinal  Patient location during procedure: OR Start time: 05/24/2018 7:32 AM End time: 05/24/2018 7:35 AM Staffing Anesthesiologist: Emmie Niemann, MD Resident/CRNA: Nelda Marseille, CRNA Performed: resident/CRNA  Preanesthetic Checklist Completed: patient identified, site marked, surgical consent, pre-op evaluation, timeout performed, IV checked, risks and benefits discussed and monitors and equipment checked Spinal Block Patient position: sitting Prep: ChloraPrep Patient monitoring: heart rate, continuous pulse ox, blood pressure and cardiac monitor Approach: midline Location: L4-5 Injection technique: single-shot Needle Needle type: Introducer and Pencil-Tip  Needle gauge: 24 G Needle length: 9 cm Additional Notes Negative paresthesia. Negative blood return. Positive free-flowing CSF. Expiration date of kit checked and confirmed. Patient tolerated procedure well, without complications.

## 2018-05-24 NOTE — Anesthesia Post-op Follow-up Note (Signed)
Anesthesia QCDR form completed.        

## 2018-05-24 NOTE — Anesthesia Preprocedure Evaluation (Signed)
Anesthesia Evaluation  Patient identified by MRN, date of birth, ID band Patient awake    Reviewed: Allergy & Precautions, NPO status , Patient's Chart, lab work & pertinent test results  History of Anesthesia Complications Negative for: history of anesthetic complications  Airway Mallampati: II  TM Distance: >3 FB Neck ROM: Full    Dental no notable dental hx.    Pulmonary neg pulmonary ROS, neg sleep apnea, neg COPD,    breath sounds clear to auscultation- rhonchi (-) wheezing      Cardiovascular Exercise Tolerance: Good (-) hypertension(-) CAD and (-) Past MI  Rhythm:Regular Rate:Normal - Systolic murmurs and - Diastolic murmurs    Neuro/Psych PSYCHIATRIC DISORDERS Depression negative neurological ROS     GI/Hepatic negative GI ROS, Neg liver ROS,   Endo/Other  negative endocrine ROSneg diabetes  Renal/GU negative Renal ROS     Musculoskeletal  (+) Arthritis ,   Abdominal (+) - obese,   Peds  Hematology negative hematology ROS (+)   Anesthesia Other Findings Past Medical History: No date: Arthritis     Comment:  osteoarthritis right hip No date: Hyperlipemia   Reproductive/Obstetrics                             Lab Results  Component Value Date   WBC 7.8 05/10/2018   HGB 15.5 05/10/2018   HCT 44.4 05/10/2018   MCV 88.4 05/10/2018   PLT 239 05/10/2018    Anesthesia Physical Anesthesia Plan  ASA: II  Anesthesia Plan: Spinal   Post-op Pain Management:    Induction:   PONV Risk Score and Plan: 1 and Propofol infusion  Airway Management Planned: Natural Airway  Additional Equipment:   Intra-op Plan:   Post-operative Plan:   Informed Consent: I have reviewed the patients History and Physical, chart, labs and discussed the procedure including the risks, benefits and alternatives for the proposed anesthesia with the patient or authorized representative who has  indicated his/her understanding and acceptance.   Dental advisory given  Plan Discussed with: CRNA and Anesthesiologist  Anesthesia Plan Comments:         Anesthesia Quick Evaluation

## 2018-05-25 LAB — BASIC METABOLIC PANEL
Anion gap: 6 (ref 5–15)
BUN: 15 mg/dL (ref 6–20)
CO2: 27 mmol/L (ref 22–32)
Calcium: 8.8 mg/dL — ABNORMAL LOW (ref 8.9–10.3)
Chloride: 108 mmol/L (ref 98–111)
Creatinine, Ser: 1.01 mg/dL (ref 0.61–1.24)
GFR calc non Af Amer: >60 mL/min (ref >60)
Glucose, Bld: 116 mg/dL — ABNORMAL HIGH (ref 70–99)
Potassium: 3.8 mmol/L (ref 3.5–5.1)
Sodium: 141 mmol/L (ref 135–145)

## 2018-05-25 LAB — CBC
HCT: 42.9 % (ref 39.0–52.0)
Hemoglobin: 14.6 g/dL (ref 13.0–17.0)
MCH: 30.5 pg (ref 26.0–34.0)
MCHC: 34 g/dL (ref 30.0–36.0)
MCV: 89.6 fL (ref 80.0–100.0)
Platelets: 208 10*3/uL (ref 150–400)
RBC: 4.79 MIL/uL (ref 4.22–5.81)
RDW: 12.9 % (ref 11.5–15.5)
WBC: 8.5 10*3/uL (ref 4.0–10.5)
nRBC: 0 % (ref 0.0–0.2)

## 2018-05-25 MED ORDER — ASPIRIN 81 MG PO CHEW: 81.0000 mg | CHEWABLE_TABLET | Freq: Two times a day (BID) | ORAL | 0 refills | Status: DC

## 2018-05-25 NOTE — Discharge Instructions (Signed)

## 2018-05-25 NOTE — Progress Notes (Signed)
Patient is being discharged home with wife. IV removed with cath intact. Reviewed meds and discharge instructions. No new scripts. Dressing intact with no drainage to left hip.

## 2018-05-25 NOTE — Anesthesia Postprocedure Evaluation (Signed)
Anesthesia Post Note  Patient: Adam [name]  Procedure(s) Performed: TOTAL HIP ARTHROPLASTY ANTERIOR APPROACH (Left )  Patient location during evaluation: Nursing Unit Anesthesia Type: Spinal Level of consciousness: oriented and awake and alert Pain management: pain level controlled Vital Signs Assessment: post-procedure vital signs reviewed and stable Respiratory status: spontaneous breathing Cardiovascular status: blood pressure returned to baseline and stable Postop Assessment: no headache, no backache, no apparent nausea or vomiting and able to ambulate Anesthetic complications: no     Last Vitals:  Vitals:   05/24/18 2300 05/25/18 0803  BP: 110/69 113/65  Pulse: 83 90  Resp: 18   Temp: 37.3 C 36.4 C  SpO2: 97% 99%    Last Pain:  Vitals:   05/25/18 0803  TempSrc: Oral  PainSc:                  Adam [name]

## 2018-05-25 NOTE — Progress Notes (Signed)
Patient is found to be walking around the room without assisted device or staff.  Talked with patient and he stated that he didn't need help and walked in the hallway without an assisted device with therapy.

## 2018-05-25 NOTE — Discharge Summary (Signed)
Physician Discharge Summary  Patient ID: Adam [name] MRN: [id number] DOB/AGE: 1961-05-10 57 y.o.  Admit date: 05/24/2018 Discharge date: 05/25/2018  Admission Diagnoses:  OSTEOARTHRITIS OF LEFT HIP <principal problem not specified>  Discharge Diagnoses:  OSTEOARTHRITIS OF LEFT HIP Active Problems:   Osteoarthritis of left hip   Past Medical History:  Diagnosis Date  . Arthritis    osteoarthritis right hip  . Hyperlipemia     Surgeries: Procedure(s): TOTAL HIP ARTHROPLASTY ANTERIOR APPROACH on 05/24/2018   Consultants (if any):   Discharged Condition: Improved  Hospital Course: Adam [name] is an 57 y.o. male who was admitted 05/24/2018 with a diagnosis of  OSTEOARTHRITIS OF LEFT HIP <principal problem not specified> and went to the operating room on 05/24/2018 and underwent the above named procedures.    He was given perioperative antibiotics:  Anti-infectives (From admission, onward)   Start     Dose/Rate Route Frequency Ordered Stop   05/24/18 1200  ceFAZolin (ANCEF) IVPB 2g/100 mL premix     2 g 200 mL/hr over 30 Minutes Intravenous Every 6 hours 05/24/18 1053 05/24/18 1859   05/24/18 0925  50,000 units bacitracin in 0.9% normal saline 250 mL irrigation  Status:  Discontinued       As needed 05/24/18 0925 05/24/18 0940   05/24/18 0619  ceFAZolin (ANCEF) 2-4 GM/100ML-% IVPB    Note to Pharmacy:  Fulton Mole: cabinet override      05/24/18 0619 05/24/18 0743   05/24/18 0600  ceFAZolin (ANCEF) IVPB 2g/100 mL premix     2 g 200 mL/hr over 30 Minutes Intravenous On call to O.R. 05/23/18 2320 05/24/18 0753    .  He was given sequential compression devices, early ambulation, and aspirin for DVT prophylaxis.  He benefited maximally from the hospital stay and there were no complications.    Recent vital signs:  Vitals:   05/24/18 2300 05/25/18 0803  BP: 110/69 113/65  Pulse: 83 90  Resp: 18   Temp: 99.1 F (37.3 C) 97.6 F (36.4 C)  SpO2: 97% 99%     Recent laboratory studies:  Lab Results  Component Value Date   HGB 14.6 05/25/2018   HGB 15.5 05/10/2018   HGB 12.8 (L) 07/31/2017   Lab Results  Component Value Date   WBC 8.5 05/25/2018   PLT 208 05/25/2018   Lab Results  Component Value Date   INR 0.90 05/10/2018   Lab Results  Component Value Date   NA 141 05/25/2018   K 3.8 05/25/2018   CL 108 05/25/2018   CO2 27 05/25/2018   BUN 15 05/25/2018   CREATININE 1.01 05/25/2018   GLUCOSE 116 (H) 05/25/2018    Discharge Medications:   Allergies as of 05/25/2018   No Known Allergies     Medication List    STOP taking these medications   aspirin EC 81 MG tablet Replaced by:  aspirin 81 MG chewable tablet     TAKE these medications   aspirin 81 MG chewable tablet Chew 1 tablet (81 mg total) by mouth 2 (two) times daily. Replaces:  aspirin EC 81 MG tablet   ibuprofen 600 MG tablet Commonly known as:  ADVIL,MOTRIN Take 600 mg by mouth every 6 (six) hours as needed.   rosuvastatin 10 MG tablet Commonly known as:  CRESTOR Take 10 mg by mouth every evening.       Diagnostic Studies: Dg Pelvis Portable  Result Date: 05/24/2018 CLINICAL DATA:  Status post left hip replacement EXAM:  PORTABLE PELVIS 1-2 VIEWS COMPARISON:  07/30/2017 FINDINGS: Bilateral hip replacements are now seen. No acute fracture or dislocation is noted. No soft tissue abnormality is seen. IMPRESSION: Status post left hip replacement.  No acute abnormality noted. Electronically Signed   By: Alcide CleverMark  Lukens M.D.   On: 05/24/2018 10:50   Dg Hip Operative Unilat W Or W/o Pelvis Left  Result Date: 05/24/2018 CLINICAL DATA:  Intraoperative images from hip arthroplasty. EXAM: OPERATIVE LEFT HIP (WITH PELVIS IF PERFORMED) TECHNIQUE: Fluoroscopic spot image(s) were submitted for interpretation post-operatively. COMPARISON:  Pelvis radiograph 07/30/2017 FINDINGS: Intraoperative images from left hip arthroplasty demonstrate placement of long-stem femoral  component and screwed in acetabular component. The alignment is normal. No evidence of fracture. Fluoroscopy time is recorded as 18 seconds. IMPRESSION: Intraoperative images from total left hip arthroplasty without evidence of immediate complications. Electronically Signed   By: Ted Mcalpineobrinka  Dimitrova M.D.   On: 05/24/2018 10:28    Disposition: Discharge disposition: 01-Home or Self Care            Signed: Lyndle HerrlichJames R Rohen Kimes ,MD 05/25/2018, 12:58 PM

## 2018-05-25 NOTE — Plan of Care (Signed)

## 2018-05-25 NOTE — Progress Notes (Signed)
Physical Therapy Treatment Patient Details Name: Adam [name] MRN: [id number] DOB: [birth year] Today's Date: 05/25/2018    History of Present Illness Pt is a 57 y.o. male s/p L THR anterior approach secondary OA 05/24/18.  PMH includes R THA anterior approach 07/30/17.    PT Comments    Pt up and ambulating in room upon arrival with [facility].  Pt was able to ambulate x 2 around unit with [facility] and eventually progressing to no device.  Stair training complete.  Up/down x 2 forward and backwards per his direction with SPC and rail for support.  No LOB or mobility concerns.  Encouraged pt not to overdo it and he voiced understanding stating he did overdo it last time.  Pt anticipates discharge home today.   Follow Up Recommendations  Outpatient PT     Equipment Recommendations  Cane    Recommendations for Other Services       Precautions / Restrictions Precautions Precautions: Fall;Anterior Hip Precaution Comments: Avoid SLR (pt reports precaution from MD) Restrictions Weight Bearing Restrictions: No LLE Weight Bearing: Weight bearing as tolerated    Mobility  Bed Mobility Overal bed mobility: Modified Independent                Transfers Overall transfer level: Modified independent                  Ambulation/Gait Ambulation/Gait assistance: Modified independent (Device/Increase time) Gait Distance (Feet): 350 Feet Assistive device: None;Straight cane Gait Pattern/deviations: Step-through pattern   Gait velocity interpretation: 1.31 - 2.62 ft/sec, indicative of limited community ambulator     Stairs Stairs: Yes Stairs assistance: Modified independent (Device/Increase time) Stair Management: Alternating pattern;One rail Left Number of Stairs: 8     Wheelchair Mobility    Modified Rankin (Stroke Patients Only)       Balance Overall balance assessment: Modified Independent                                          Cognition  Arousal/Alertness: Awake/alert Behavior During Therapy: WFL for tasks assessed/performed Overall Cognitive Status: Within Functional Limits for tasks assessed                                        Exercises      General Comments        Pertinent Vitals/Pain Pain Assessment: 0-10 Pain Location: L hip did not rate "Of course it hurts" Pain Descriptors / Indicators: Sore Pain Intervention(s): Limited activity within patient's tolerance;Monitored during session    Home Living                      Prior Function            PT Goals (current goals can now be found in the care plan section) Progress towards PT goals: Progressing toward goals    Frequency    BID      PT Plan Current plan remains appropriate    Co-evaluation              AM-PAC PT "6 Clicks" Mobility   Outcome Measure  Help needed turning from your back to your side while in a flat bed without using bedrails?: None Help needed moving from lying on your back to sitting on  the side of a flat bed without using bedrails?: None Help needed moving to and from a bed to a chair (including a wheelchair)?: None Help needed standing up from a chair using your arms (e.g., wheelchair or bedside chair)?: None Help needed to walk in hospital room?: None Help needed climbing 3-5 steps with a railing? : None 6 Click Score: 24    End of Session   Activity Tolerance: Patient tolerated treatment well Patient left: Other (comment)   Pain - Right/Left: Left Pain - part of body: Hip     Time: [id number] PT Time Calculation (min) (ACUTE ONLY): 8 min  Charges:  $Gait Training: 8-22 mins                     Danielle DessSarah Rand Boller, PTA 05/25/18, 9:00 AM

## 2018-05-27 ENCOUNTER — Other Ambulatory Visit: Payer: Self-pay | Admitting: *Deleted

## 2018-05-27 NOTE — Patient Outreach (Addendum)
[facility]) Care Management  05/27/2018  Adam HuskyDavid J [name] 07/25/1960 [id number]   Subjective: Telephone call to patient's home  / mobile number, no answer, left HIPAA compliant voicemail message, and requested call back.     Objective: Per KPN (Knowledge Performance Now, point of care tool) and chart review,patient hospitalized 05/24/18 - 05/25/18 for OSTEOARTHRITIS OF LEFT HIP, status post Left Total Hip Replacement on 05/24/18.   Patienthospitalized2/11/19 -2/12/19forUnilateral primary osteoarthritis, right hip. Status post TOTALRIGHTHIP ARTHROPLASTY ANTERIOR APPROACHon 2/11/19at [facility]. Patient has a history of hyperlipidemia, Spermatocele, andRight inguinal hernia. [facility] Transition of care follow up completed on 08/02/17.        Assessment: Received UMR Preoperative / Transition of care referral on 05/22/18.  Transition of care follow up pending patient contact.      Plan: RNCM will send unsuccessful outreach  letter, [facility] pamph[facility], will call patient for 2nd telephone outreach attempt, transition of care follow up, and proceed with case closure, within 10 business days if no return call.        [name] H. Gardiner Barefootooper RN, BSN, CCM [facility] Care [facility] Telephonic CM Phone: [phone] Fax: [phone]

## 2018-05-28 LAB — SURGICAL PATHOLOGY

## 2018-05-29 ENCOUNTER — Other Ambulatory Visit: Payer: Self-pay | Admitting: *Deleted

## 2018-05-29 ENCOUNTER — Encounter: Payer: Self-pay | Admitting: *Deleted

## 2018-05-29 NOTE — Patient Outreach (Signed)
[facility]) Care Management  05/29/2018  Elonda HuskyDavid J Mcleroy [birth year] [id number]   Subjective: Telephone call to patient's home / mobile number, spoke with patient, and HIPAA verified.  D[facility]ed [facility] UMR Transition of care follow up, patient voiced understanding, and is in agreement to follow up.   Patient states he is doing well, does not need anything, receiving a lot of calls from people checking on him, and remembers speaking with this RNCM in the past.   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  States he has a follow up appointment with surgeon on 06/04/18.   Patient states he is able to manage self care and has assistance as needed.  Patient voices understanding of medical diagnosis, surgery, and treatment plan. States he is accessing the following [facility] benefits: outpatient pharmacy, hospital indemnity (not chosen), and does not need family medical leave act (FMLA) at this time.   States he is very appreciative of the follow up and is in agreement to receive [facility] information.    Patient states he would also like to give feedback to someone in hospital  administration regarding how to improve patient's overall experience, RNCM advised will send update message to [facility] Clinical Director Bjorn Loser(Rhonda Rumple) to follow up with patient.        Objective: Per KPN (Knowledge Performance Now, point of care tool) and chart review,patient hospitalized 05/24/18 - 05/25/18 for OSTEOARTHRITIS OF LEFT HIP, status post LeftTotal Hip Replacement on 05/24/18.   Patienthospitalized2/11/19 -2/12/19forUnilateral primary osteoarthritis, right hip. Status post TOTALRIGHTHIP ARTHROPLASTY ANTERIOR APPROACHon 2/11/19at [facility]. Patient has a history of hyperlipidemia, Spermatocele, andRight inguinal hernia. [facility] Transition of care follow up completed on 08/02/17.        Assessment: Received UMR  Preoperative / Transition of care referral on 05/22/18.  Transition of care follow up completed, no care management needs, and will proceed with case closure.       Plan: RNCM will send patient successful outreach letter, [facility] pamphlet, and magnet. RNCM will complete case closure due to follow up completed / no care management needs.  RNCM will send patient follow up request to [facility] Clinical Director Bjorn Loser(Rhonda Rumple) per patient's request.         Satina Jerrell H. Gardiner Barefootooper RN, BSN, CCM [facility] Care [facility] Telephonic CM Phone: [phone] Fax: [phone]

## 2018-11-06 DIAGNOSIS — Z20828 Contact with and (suspected) exposure to other viral communicable diseases: Secondary | ICD-10-CM | POA: Diagnosis not present

## 2019-01-29 DIAGNOSIS — Z125 Encounter for screening for malignant neoplasm of prostate: Secondary | ICD-10-CM | POA: Diagnosis not present

## 2019-01-29 DIAGNOSIS — M519 Unspecified thoracic, thoracolumbar and lumbosacral intervertebral disc disorder: Secondary | ICD-10-CM | POA: Diagnosis not present

## 2019-01-29 DIAGNOSIS — E785 Hyperlipidemia, unspecified: Secondary | ICD-10-CM | POA: Diagnosis not present

## 2019-01-29 DIAGNOSIS — Z0000 Encounter for general adult medical examination without abnormal findings: Secondary | ICD-10-CM | POA: Diagnosis not present

## 2019-03-31 DIAGNOSIS — H5213 Myopia, bilateral: Secondary | ICD-10-CM | POA: Diagnosis not present

## 2019-03-31 DIAGNOSIS — H11001 Unspecified pterygium of right eye: Secondary | ICD-10-CM | POA: Diagnosis not present

## 2019-05-07 NOTE — Progress Notes (Addendum)
[facility] Note  05/19/2019     CHIEF COMPLAINT Patient presents for Retina Evaluation   HISTORY OF PRESENT ILLNESS: Adam [name] is a 58 y.o. male who presents to the clinic today for:   HPI    Retina Evaluation    In both eyes.  Onset: Unknown.  Duration: Unknown.  Context:  distance vision, mid-range vision and near vision.  Treatments tried include no treatments.  I, the attending physician,  performed the HPI with the patient and updated documentation appropriately.          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  Last seen by Le[facility] on 04/06/19.  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  VA good OU cc.  Denies pain, flashes, floaters.  No gtts.       Last edited by Bernarda Caffey, MD on 05/19/2019  5:59 PM. (History)    pt states he was sent here by Dr. Orion Modest bc he saw a hole in the back of his eye, pt states he saw Dr. Ellin Mayhew for routine eye exam for contacts, pt has no complaints about his vision, he denies flashes and floaters  Referring physician: Anell Barr, [place] [employer],  [place] [address]  HISTORICAL INFORMATION:   Selected notes from the [facility] eval referred by [facility] OD: 20/20-1 [facility]A OS: 20/20 LEE: 03-31-2019   CURRENT MEDICATIONS: Current Outpatient Medications (Ophthalmic Drugs)  Medication Sig  . prednisoLONE acetate (PRED FORTE) 1 % ophthalmic suspension Place 1 drop into the left eye 4 (four) times daily for 7 days.   No current facility-administered medications for this visit.  (Ophthalmic Drugs)   Current Outpatient Medications (Other)  Medication Sig  . aspirin 81 MG chewable tablet Chew 1 tablet (81 mg total) by mouth 2 (two) times daily.  Marland Kitchen ibuprofen (ADVIL,MOTRIN) 600 MG tablet Take 600 mg by mouth every 6 (six) hours as needed.  . pravastatin (PRAVACHOL) 10 MG tablet Take by mouth.  . rosuvastatin  (CRESTOR) 10 MG tablet Take 10 mg by mouth every evening.   No current facility-administered medications for this visit.  (Other)      REVIEW OF SYSTEMS: ROS    Positive for: Musculoskeletal, Eyes   Negative for: Constitutional, Gastrointestinal, Neurological, Skin, Genitourinary, HENT, Endocrine, Cardiovascular, Respiratory, Psychiatric, Allergic/Imm, Heme/Lymph   Last edited by Matthew Folks, COA on 05/19/2019  2:01 PM. (History)       ALLERGIES No Known Allergies  PAST MEDICAL HISTORY Past Medical History:  Diagnosis Date  . Arthritis    osteoarthritis right hip  . Hyperlipemia    Past Surgical History:  Procedure Laterality Date  . COLONOSCOPY    . none    . TOTAL HIP ARTHROPLASTY Right 07/30/2017   Procedure: TOTAL HIP ARTHROPLASTY ANTERIOR APPROACH;  Surgeon: Lovell Sheehan, MD;  Location: [facility] ORS;  Service: Orthopedics;  Laterality: Right;  . TOTAL HIP ARTHROPLASTY Left 05/24/2018   Procedure: TOTAL HIP ARTHROPLASTY ANTERIOR APPROACH;  Surgeon: Lovell Sheehan, MD;  Location: [facility] ORS;  Service: Orthopedics;  Laterality: Left;    FAMILY HISTORY Family History  Problem Relation Age of Onset  . Breast cancer Mother   . Hypercholesterolemia Father   . Pancreatic cancer Maternal Grandfather   . Heart disease Paternal Grandfather   . Colon cancer Paternal Grandfather   . Diabetes Paternal Grandfather   .  Heart disease Paternal Grandmother   . Prostate cancer Neg Hx   . Bladder Cancer Neg Hx   . Kidney cancer Neg Hx     SOCIAL HISTORY Social History   Tobacco Use  . Smoking status: Never Smoker  . Smokeless tobacco: Never Used  Substance Use Topics  . Alcohol use: No  . Drug use: No         OPHTHALMIC EXAM:  Base Eye Exam    Visual Acuity (Snellen - Linear)      Right Left   Dist cc 20/25 20/20   Dist ph cc NI    Correction: Glasses       Tonometry (Tonopen, 2:02 PM)      Right Left   Pressure 11 12       Pupils      Dark Light  Shape React APD   Right 4 3 Round Brisk None   Left 4 3 Round Brisk None       Visual Fields (Counting fingers)      Left Right    Full Full       Extraocular Movement      Right Left    Full, Ortho Full, Ortho       Neuro/Psych    Oriented x3: Yes   Mood/Affect: Normal       Dilation    Both eyes: 1.0% Mydriacyl, 2.5% Phenylephrine @ 2:02 PM        Slit Lamp and Fundus Exam    Slit Lamp Exam      Right Left   Lids/Lashes Dermatochalasis - upper lid, Telangiectasia, Meibomian gland dysfunction Dermatochalasis - upper lid, Telangiectasia, Meibomian gland dysfunction   Conjunctiva/Sclera Temporal Pinguecula, nasal pterygium White and quiet   Cornea 1+ Punctate epithelial erosions, nasal pterygium 1-2+ Punctate epithelial erosions, mild Arcus   Anterior Chamber Deep and quiet Deep and quiet   Iris Round and dilated Round and dilated   Lens 1+ Cortical cataract Clear   Vitreous Vitreous syneresis Vitreous syneresis       Fundus Exam      Right Left   Disc Pink and Sharp Pink and Sharp   C/D Ratio 0.55 0.6   Macula Flat, Blunted foveal reflex, No heme or edema Flat, good foveal reflex, No heme or edema   Vessels Mild Vascular attenuation Mild Vascular attenuation   Periphery Attached    Attached, pigmented retinal break/VR tuft at 0100, no SRF        Refraction    Wearing Rx      Sphere Cylinder Axis Add   Right -2.00 +1.00 013 +2.00   Left -2.50 +1.25 146 +2.00   Age: 66yr   Type: PAL       Manifest Refraction      Sphere Cylinder Axis Dist VA   Right -2.25 +0.75 015 20/20-2   Left -2.50 +1.25 180 20/20+          IMAGING AND PROCEDURES  Imaging and Procedures for @  OCT, Retina - OU - Both Eyes       Right Eye Quality was good. Central Foveal Thickness: 257. Progression has no prior data. Findings include normal foveal contour, no IRF, no SRF.   Left Eye Quality was good. Central Foveal Thickness: 268. Progression has no prior data.  Findings include normal foveal contour, no IRF, no SRF.   Notes *Images captured and stored on drive  Diagnosis / Impression:  NFP, no IRF/SRF OU  Clinical management:  See below  Abbreviations: NFP - Normal foveal profile. CME - cystoid macular edema. PED - pigment epithelial detachment. IRF - intraretinal fluid. SRF - subretinal fluid. EZ - ellipsoid zone. ERM - epiretinal membrane. ORA - outer retinal atrophy. ORT - outer retinal tubulation. SRHM - subretinal hyper-reflective material        Repair Retinal Breaks, Laser - OS - Left Eye       LASER PROCEDURE NOTE  Procedure:  Barrier laser retinopexy using slit lamp laser, LEFT eye   Diagnosis:   Pigmented retinal break/VR tuft, left eye                     1 o'clock anterior to equator   Surgeon: Rennis ChrisBrian Valin Massie, MD, PhD  Anesthesia: Topical  Informed consent obtained, operative eye marked, and time out performed prior to initiation of laser.   Laser settings:  Lumenis Smart532 laser, slit lamp Lens: Mainster PRP 165 Power: 270 mW Spot size: 200 microns Duration: 30 msec  # spots: 167  Placement of laser: Using a Mainster PRP 165 contact lens at the slit lamp, laser was placed in three confluent rows around pigmented retinal break / VR tuft at 1 oclock anterior to equator with additional rows anteriorly.  Complications: None.  Patient tolerated the procedure well and received written and verbal post-procedure care information/education.                  ASSESSMENT/PLAN:    ICD-10-CM   1. Left retinal defect  H33.302 Repair Retinal Breaks, Laser - OS - Left Eye  2. Retinal edema  H35.81 OCT, Retina - OU - Both Eyes  3. Myopia of both eyes with astigmatism  H52.13    H52.203   4. Pterygium of eye, right  H11.001     1,2. Pigmented retinal break / VR tuft OS  - The incidence, risk factors, and natural history of retinal break was discussed with patient.    - Potential treatment options including laser  retinopexy and cryotherapy discussed with patient.  - small pigmented retinal break located at 0100, no SRF  - recommend laser retinopexy OS today, 11.30.20  - pt wishes to proceed  - RBA of procedure discussed, questions answered  - informed consent obtained and signed  - see procedure note  - start PF QID x7 days  - f/u in 3-4 wks  3. Myopia w/ astigmatism  - corrects to 20/20 OU  - monitor  4. Nasal pterygium OD  - not visually significant  - monitor    Ophthalmic Meds Ordered this visit:  Meds ordered this encounter  Medications  . prednisoLONE acetate (PRED FORTE) 1 % ophthalmic suspension    Sig: Place 1 drop into the left eye 4 (four) times daily for 7 days.    Dispense:  10 mL    Refill:  0       Return in about 3 weeks (around 06/09/2019) for POV.  There are no Patient Instructions on file for this visit.    This document serves as a record of services personally performed by Karie ChimeraBrian G. Rhonna Holster, MD, PhD. It was created on their behalf by Herby AbrahamAshley English, COA, a certified ophthalmic assistant. The creation of this record is the provider's dictation and/or activities during the visit.    Electronically signed by: Herby AbrahamAshley English, COA @TODAY @ 9:08 PM   This document serves as a record of services personally performed by Karie ChimeraBrian G. Dallen Bunte, MD, PhD. It was created on  their behalf by Laurian Brim, OA, an ophthalmic assistant. The creation of this record is the provider's dictation and/or activities during the visit.    Electronically signed by: Laurian Brim, OA 11.30.2020 9:08 PM    Explained the diagnoses, plan, and follow up with the patient and they expressed understanding.  Patient expressed understanding of the importance of proper follow up care.   Karie Chimera, M.D., Ph.D. Diseases & Surgery of the Retina and Vitreous [facility]   I have reviewed the above documentation for accuracy and completeness, and I agree with the above. Karie Chimera, M.D., Ph.D. 05/19/19 9:08 PM   Abbreviations: M myopia (nearsighted); A astigmatism; H hyperopia (farsighted); P presbyopia; Mrx spectacle prescription;  CTL contact lenses; OD right eye; OS left eye; OU both eyes  XT exotropia; ET esotropia; PEK punctate epithelial keratitis; PEE punctate epithelial erosions; DES dry eye syndrome; MGD meibomian gland dysfunction; ATs artificial tears; PFAT's preservative free artificial tears; NSC nuclear sclerotic cataract; PSC posterior subcapsular cataract; ERM epi-retinal membrane; PVD posterior vitreous detachment; RD retinal detachment; DM diabetes mellitus; DR diabetic retinopathy; NPDR non-proliferative diabetic retinopathy; PDR proliferative diabetic retinopathy; CSME clinically significant macular edema; DME diabetic macular edema; dbh dot blot hemorrhages; CWS cotton wool spot; POAG primary open angle glaucoma; C/D cup-to-disc ratio; HVF humphrey visual field; GVF goldmann visual field; OCT optical coherence tomography; IOP intraocular pressure; BRVO Branch retinal vein occlusion; CRVO central retinal vein occlusion; CRAO central retinal artery occlusion; BRAO branch retinal artery occlusion; RT retinal tear; SB scleral buckle; PPV pars plana vitrectomy; VH Vitreous hemorrhage; PRP panretinal laser photocoagulation; IVK intravitreal kenalog; VMT vitreomacular traction; MH Macular hole;  NVD neovascularization of the disc; NVE neovascularization elsewhere; AREDS age related eye disease study; ARMD age related macular degeneration; POAG primary open angle glaucoma; EBMD epithelial/anterior basement membrane dystrophy; ACIOL anterior chamber intraocular lens; IOL intraocular lens; PCIOL posterior chamber intraocular lens; Phaco/IOL phacoemulsification with intraocular lens placement; PRK photorefractive keratectomy; LASIK laser assisted in situ keratomileusis; HTN hypertension; DM diabetes mellitus; COPD chronic obstructive pulmonary disease

## 2019-05-19 ENCOUNTER — Ambulatory Visit (INDEPENDENT_AMBULATORY_CARE_PROVIDER_SITE_OTHER): Payer: 59 | Admitting: Ophthalmology

## 2019-05-19 ENCOUNTER — Encounter (INDEPENDENT_AMBULATORY_CARE_PROVIDER_SITE_OTHER): Payer: Self-pay | Admitting: Ophthalmology

## 2019-05-19 DIAGNOSIS — H5213 Myopia, bilateral: Secondary | ICD-10-CM

## 2019-05-19 DIAGNOSIS — H3581 Retinal edema: Secondary | ICD-10-CM | POA: Diagnosis not present

## 2019-05-19 DIAGNOSIS — H33302 Unspecified retinal break, left eye: Secondary | ICD-10-CM

## 2019-05-19 DIAGNOSIS — H52203 Unspecified astigmatism, bilateral: Secondary | ICD-10-CM | POA: Diagnosis not present

## 2019-05-19 DIAGNOSIS — H11001 Unspecified pterygium of right eye: Secondary | ICD-10-CM

## 2019-05-19 MED ORDER — PREDNISOLONE ACETATE 1 % OP SUSP: 1.0000 [drp] | Freq: Four times a day (QID) | OPHTHALMIC | 0 refills | Status: AC

## 2019-07-29 DIAGNOSIS — M20012 Mallet finger of left finger(s): Secondary | ICD-10-CM | POA: Diagnosis not present

## 2019-12-13 IMAGING — XA DG HIP (WITH PELVIS) OPERATIVE*L*
5 series · 5 of 5 positions shown · non-contrast
Comparison: Pelvis radiograph 07/30/2017

CLINICAL DATA: Intraoperative images from hip arthroplasty.

EXAM:
OPERATIVE LEFT HIP (WITH PELVIS IF PERFORMED)
TECHNIQUE: Fluoroscopic spot image(s) were submitted for interpretation
post-operatively.

[Series 1: cont. · 1 of 1 slices shown (1 of 5)]
[im 1/1]
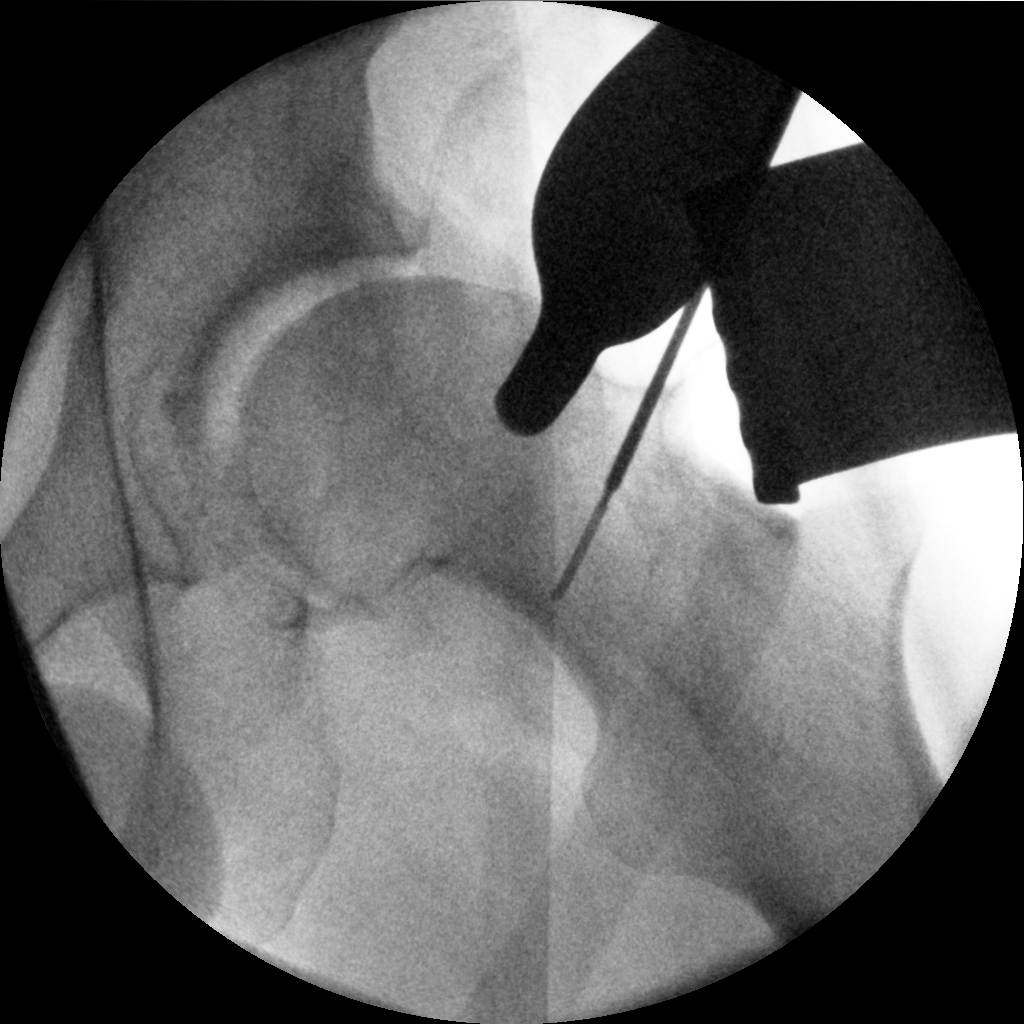

[Series 2: cont. · 1 of 1 slices shown (2 of 5)]
[im 1/1]
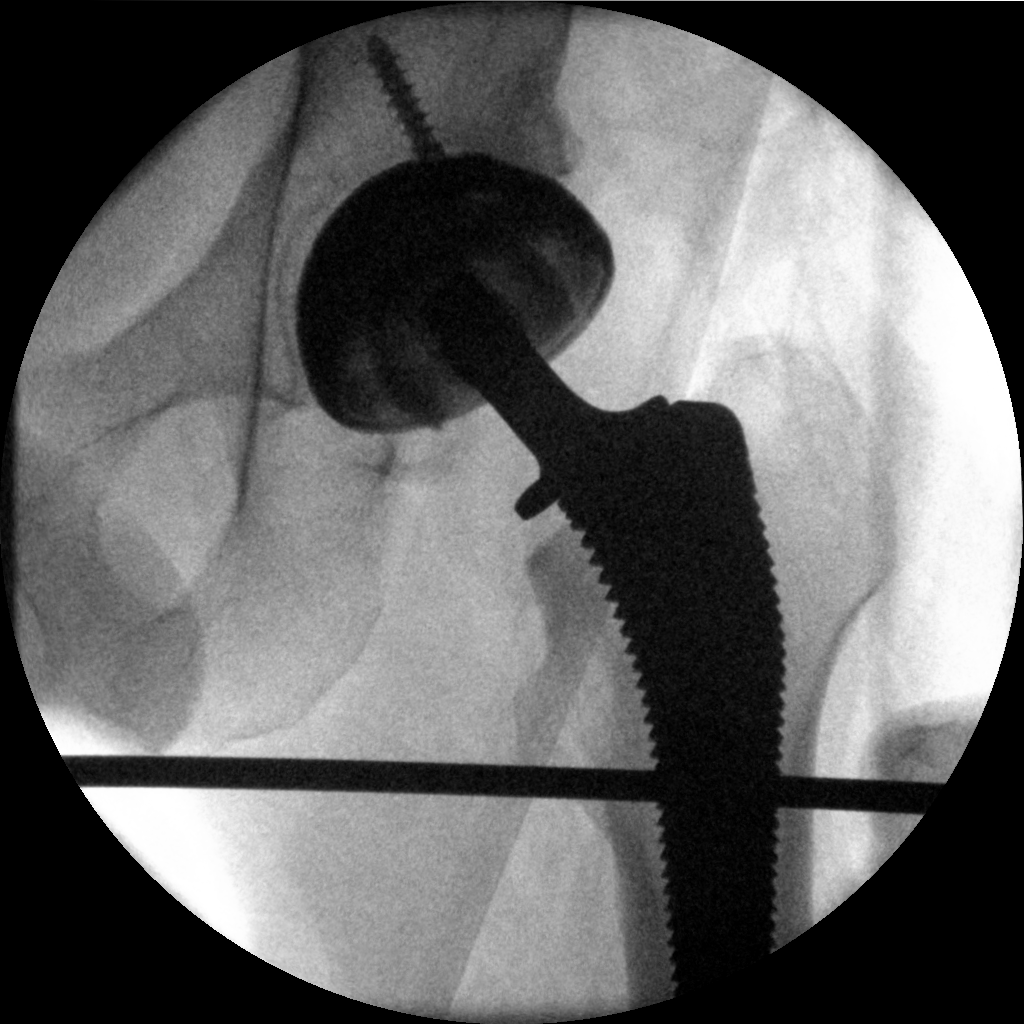

[Series 3: cont. · 1 of 1 slices shown (3 of 5)]
[im 1/1]
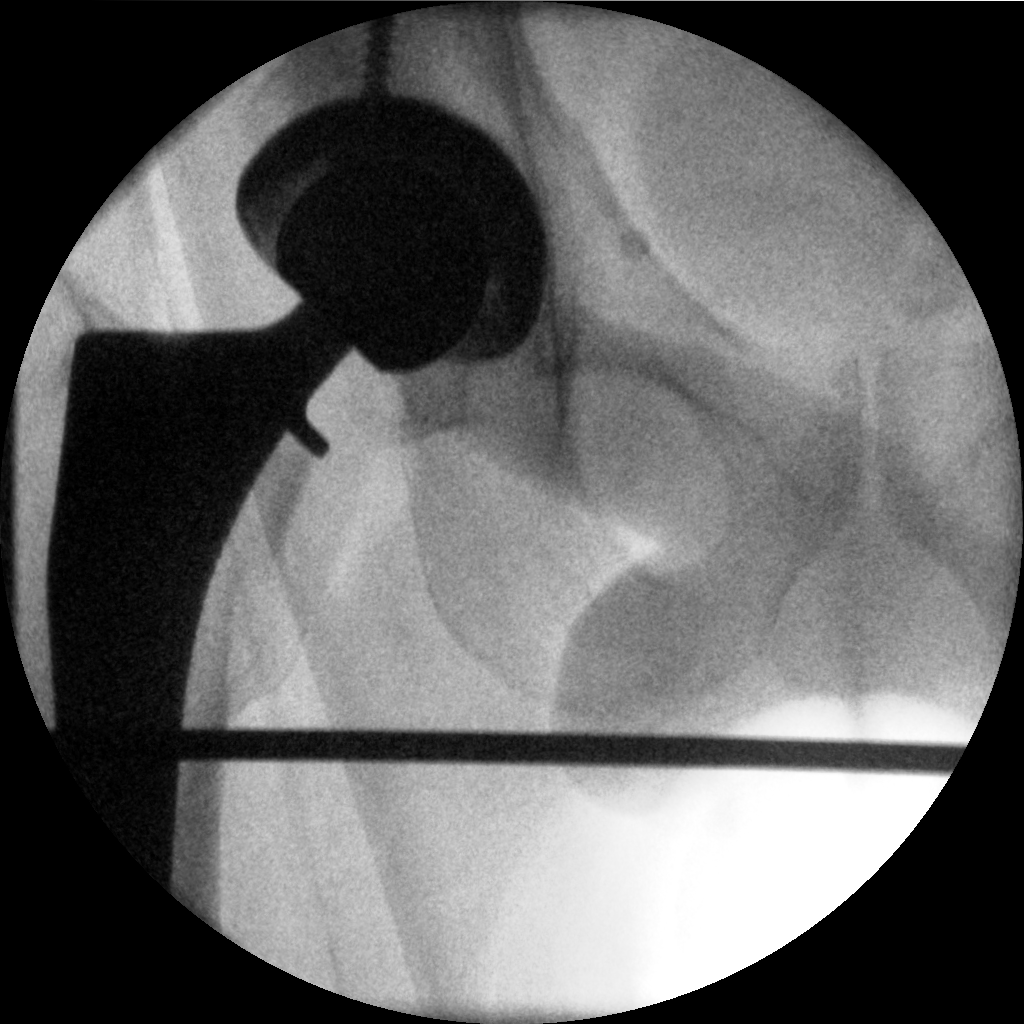

[Series 4: cont. · 1 of 1 slices shown (4 of 5)]
[im 1/1]
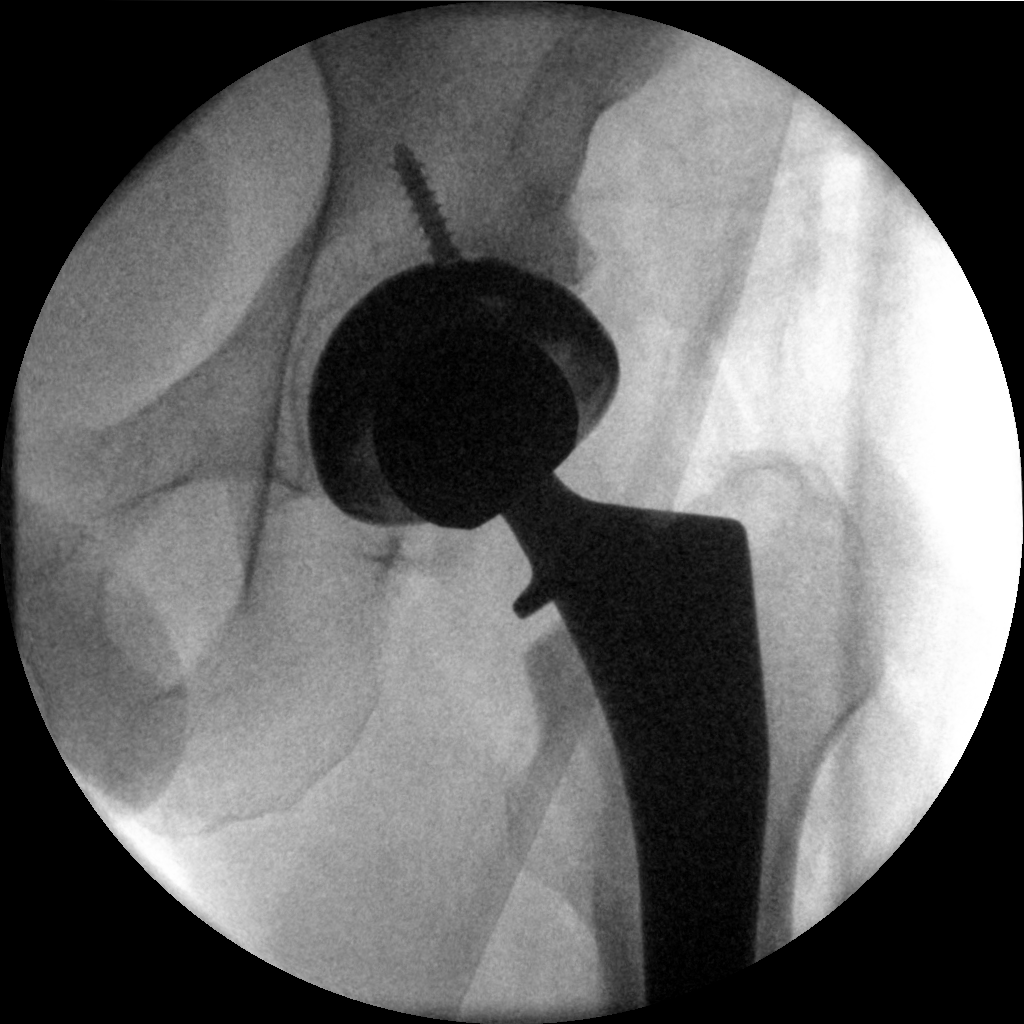

[Series 5: cont. · 1 of 1 slices shown (5 of 5)]
[im 1/1]
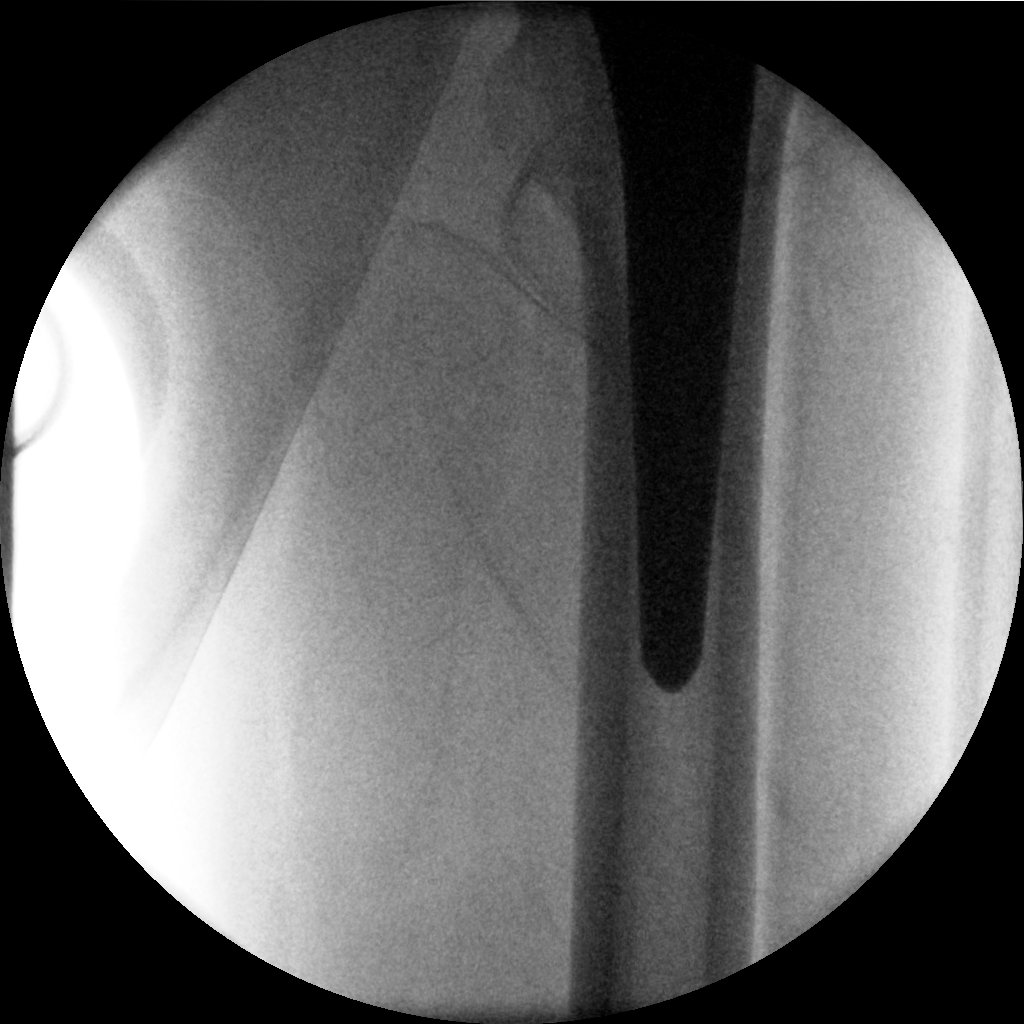

[5 of 5 positions shown; findings below may reference images not displayed]

FINDINGS: Intraoperative images from left hip arthroplasty demonstrate
placement of long-stem femoral component and screwed in acetabular
component. The alignment is normal. No evidence of fracture.

Fluoroscopy time is recorded as 18 seconds.
IMPRESSION: Intraoperative images from total left hip arthroplasty without
evidence of immediate complications.

## 2020-04-16 DIAGNOSIS — Z0000 Encounter for general adult medical examination without abnormal findings: Secondary | ICD-10-CM | POA: Diagnosis not present

## 2020-04-23 DIAGNOSIS — M519 Unspecified thoracic, thoracolumbar and lumbosacral intervertebral disc disorder: Secondary | ICD-10-CM | POA: Diagnosis not present

## 2020-04-23 DIAGNOSIS — Z96641 Presence of right artificial hip joint: Secondary | ICD-10-CM | POA: Diagnosis not present

## 2020-04-23 DIAGNOSIS — E785 Hyperlipidemia, unspecified: Secondary | ICD-10-CM | POA: Diagnosis not present

## 2020-04-23 DIAGNOSIS — Z0000 Encounter for general adult medical examination without abnormal findings: Secondary | ICD-10-CM | POA: Diagnosis not present

## 2020-05-06 DIAGNOSIS — H35419 Lattice degeneration of retina, unspecified eye: Secondary | ICD-10-CM | POA: Diagnosis not present

## 2020-05-06 DIAGNOSIS — H5213 Myopia, bilateral: Secondary | ICD-10-CM | POA: Diagnosis not present

## 2020-05-06 DIAGNOSIS — H11001 Unspecified pterygium of right eye: Secondary | ICD-10-CM | POA: Diagnosis not present

## 2020-06-02 ENCOUNTER — Other Ambulatory Visit: Payer: Self-pay | Admitting: Internal Medicine

## 2020-09-16 ENCOUNTER — Other Ambulatory Visit (HOSPITAL_COMMUNITY): Payer: Self-pay | Admitting: Emergency Medicine

## 2020-09-24 ENCOUNTER — Ambulatory Visit
Admission: RE | Admit: 2020-09-24 | Discharge: 2020-09-24 | Disposition: A | Discharge status: Home / Self Care | Payer: 59 | Source: Ambulatory Visit | Attending: Cardiology | Admitting: Cardiology

## 2020-09-24 ENCOUNTER — Other Ambulatory Visit: Payer: Self-pay

## 2020-09-27 ENCOUNTER — Other Ambulatory Visit: Payer: Self-pay

## 2020-09-27 MED FILL — Rosuvastatin Calcium Tab 10 MG: ORAL | 90 days supply | Qty: 90 | Fill #0 | Status: AC

## 2021-03-23 ENCOUNTER — Other Ambulatory Visit: Payer: Self-pay

## 2021-03-23 MED FILL — Rosuvastatin Calcium Tab 10 MG: ORAL | 90 days supply | Qty: 90 | Fill #1 | Status: AC

## 2021-05-05 DIAGNOSIS — E785 Hyperlipidemia, unspecified: Secondary | ICD-10-CM | POA: Diagnosis not present

## 2021-05-05 DIAGNOSIS — D649 Anemia, unspecified: Secondary | ICD-10-CM | POA: Diagnosis not present

## 2021-05-05 DIAGNOSIS — Z125 Encounter for screening for malignant neoplasm of prostate: Secondary | ICD-10-CM | POA: Diagnosis not present

## 2021-06-14 ENCOUNTER — Other Ambulatory Visit: Payer: Self-pay

## 2021-06-16 ENCOUNTER — Other Ambulatory Visit: Payer: Self-pay

## 2021-06-16 ENCOUNTER — Ambulatory Visit: Payer: 59 | Attending: Internal Medicine

## 2021-06-16 DIAGNOSIS — Z23 Encounter for immunization: Secondary | ICD-10-CM

## 2021-06-16 MED ORDER — PFIZER COVID-19 VAC BIVALENT 30 MCG/0.3ML IM SUSP
INTRAMUSCULAR | 0 refills | Status: DC
  Filled 2021-06-16: qty 0.3, 1d supply, fill #0

## 2021-06-16 NOTE — Progress Notes (Signed)
° °  Covid-19 Vaccination Clinic  Name:  Adam [name]    MRN: [id number] DOB: January 31, 1961  06/16/2021  Mr. Adam [name] was observed post Covid-19 immunization for 15 minutes without incident. He was provided with Vaccine Information Sheet and instruction to access the V-Safe system.   Mr. Adam [name] was instructed to call 911 with any severe reactions post vaccine: Difficulty breathing  Swelling of face and throat  A fast heartbeat  A bad rash all over body  Dizziness and weakness   Immunizations Administered     Name Date Dose VIS Date Route   Pfizer Covid-19 Vaccine Bivalent Booster 06/16/2021  1:20 PM 0.3 mL 02/16/2021 Intramuscular   Manufacturer: [employer], [employer]   Lot: [id number]   NDC: [phone]

## 2021-08-10 ENCOUNTER — Other Ambulatory Visit: Payer: Self-pay

## 2021-08-11 ENCOUNTER — Other Ambulatory Visit: Payer: Self-pay

## 2021-08-11 MED ORDER — ROSUVASTATIN CALCIUM 10 MG PO TABS
10.0000 mg | ORAL_TABLET | Freq: Every day | ORAL | 3 refills | Status: DC
  Filled 2021-08-11: qty 90, 90d supply, fill #0
  Filled 2022-01-10: qty 90, 90d supply, fill #1
  Filled 2022-06-09: qty 90, 90d supply, fill #2

## 2021-08-25 DIAGNOSIS — K4090 Unilateral inguinal hernia, without obstruction or gangrene, not specified as recurrent: Secondary | ICD-10-CM | POA: Diagnosis not present

## 2021-08-26 ENCOUNTER — Ambulatory Visit: Payer: Self-pay | Admitting: Surgery

## 2021-08-26 NOTE — H&P (View-Only) (Signed)
Subjective: ? ?CC: Non-recurrent unilateral inguinal hernia without obstruction or gangrene [K40.90] ? ?HPI: ? Adam [name] is a 61 y.o. male who presents for evaluation of above. Symptoms were first noted some time ago.  Discomfort on right, some on left. Associated with nothing, exacerbated by nothing  Lump is reducible.  ?  ?Past Medical History:  has a past medical history of Hyperlipidemia, mild (11/05/2017), Inguinal hernia of right side without obstruction or gangrene, Lumbar disc disease (11/06/2017), Primary osteoarthritis of both hips (11/05/2017), and Spermatocele. ? ?Past Surgical History:  ?Past Surgical History: ?Procedure Laterality Date ? ARTHROPLASTY HIP TOTAL Right 07/30/2017 ? ARTHROPLASTY HIP TOTAL Left 05/19/2018 ? ? ?Family History: family history includes Breast cancer in his mother; Colon cancer in his paternal grandfather; Heart disease in his paternal grandfather; High blood pressure (Hypertension) in his paternal grandfather; Hyperlipidemia (Elevated cholesterol) in his father and paternal grandfather; Pancreatic cancer in his maternal grandfather. ? ?Social History:  reports that he has never smoked. He has never used smokeless tobacco. He reports that he does not currently use alcohol. No history on file for drug use. ? ?Current Medications: has a current medication list which includes the following prescription(s): aspirin and rosuvastatin. ? ?Allergies:  ?Allergies as of 08/25/2021 ? (No Known Allergies) ? ? ?ROS:  ?A 15 point review of systems was performed and pertinent positives and negatives noted in HPI ?  ?Objective: ?  ? ? ? ?Constitutional :  Alert, cooperative, no distress ?Lymphatics/Throat:  Supple, no lymphadenopathy ?Respiratory:  clear to auscultation bilaterally ?Cardiovascular:  regular rate and rhythm ?Gastrointestinal: soft, non-tender; bowel sounds normal; no masses,  no organomegaly. inguinal hernia noted. right ?Musculoskeletal: Steady gait and movement ?Skin: Cool and  moist ?Psychiatric: Normal affect, non-agitated, not confused ?  ?  ?LABS:  ?n/a  ? ?RADS: ?n/a ?Assessment: ? ?    ?Non-recurrent unilateral inguinal hernia without obstruction or gangrene [K40.90] ? ?Plan: ? ?  ?1. Non-recurrent unilateral inguinal hernia without obstruction or gangrene [K40.90]   ?Discussed the risk of surgery including recurrence, which can be up to 50% in the case of incisional or complex hernias, possible use of prosthetic materials (mesh) and the increased risk of mesh infxn if used, bleeding, chronic pain, post-op infxn, post-op SBO or ileus, and possible re-operation to address said risks. The risks of general anesthetic, if used, includes MI, CVA, sudden death or even reaction to anesthetic medications also discussed. Alternatives include continued observation.  Benefits include possible symptom relief, prevention of incarceration, strangulation, enlargement in size over time, and the risk of emergency surgery in the face of strangulation.  ? ?Typical post-op recovery time of 3-5 days with 2 weeks of activity restrictions were also discussed. ? ?ED return precautions given for sudden increase in pain, size of hernia with accompanying fever, nausea, and/or vomiting. ? ?The patient verbalized understanding and all questions were answered to the patient's satisfaction. ? ? ?2. Patient has elected to proceed with surgical treatment. Procedure will be scheduled. Right, also likely left, robotic assisted laparoscopic ? ?labs/images/medications/previous chart entries reviewed personally and relevant changes/updates noted above. ? ? ? ?

## 2021-08-26 NOTE — H&P (Signed)
Subjective: ? ?CC: Non-recurrent unilateral inguinal hernia without obstruction or gangrene [K40.90] ? ?HPI: ? Adam [name] is a 60 y.o. male who presents for evaluation of above. Symptoms were first noted some time ago.  Discomfort on right, some on left. Associated with nothing, exacerbated by nothing  Lump is reducible.  ?  ?Past Medical History:  has a past medical history of Hyperlipidemia, mild (11/05/2017), Inguinal hernia of right side without obstruction or gangrene, Lumbar disc disease (11/06/2017), Primary osteoarthritis of both hips (11/05/2017), and Spermatocele. ? ?Past Surgical History:  ?Past Surgical History: ?Procedure Laterality Date ? ARTHROPLASTY HIP TOTAL Right 07/30/2017 ? ARTHROPLASTY HIP TOTAL Left 05/19/2018 ? ? ?Family History: family history includes Breast cancer in his mother; Colon cancer in his paternal grandfather; Heart disease in his paternal grandfather; High blood pressure (Hypertension) in his paternal grandfather; Hyperlipidemia (Elevated cholesterol) in his father and paternal grandfather; Pancreatic cancer in his maternal grandfather. ? ?Social History:  reports that he has never smoked. He has never used smokeless tobacco. He reports that he does not currently use alcohol. No history on file for drug use. ? ?Current Medications: has a current medication list which includes the following prescription(s): aspirin and rosuvastatin. ? ?Allergies:  ?Allergies as of 08/25/2021 ? (No Known Allergies) ? ? ?ROS:  ?A 15 point review of systems was performed and pertinent positives and negatives noted in HPI ?  ?Objective: ?  ? ? ? ?Constitutional :  Alert, cooperative, no distress ?Lymphatics/Throat:  Supple, no lymphadenopathy ?Respiratory:  clear to auscultation bilaterally ?Cardiovascular:  regular rate and rhythm ?Gastrointestinal: soft, non-tender; bowel sounds normal; no masses,  no organomegaly. inguinal hernia noted. right ?Musculoskeletal: Steady gait and movement ?Skin: Cool and  moist ?Psychiatric: Normal affect, non-agitated, not confused ?  ?  ?LABS:  ?n/a  ? ?RADS: ?n/a ?Assessment: ? ?    ?Non-recurrent unilateral inguinal hernia without obstruction or gangrene [K40.90] ? ?Plan: ? ?  ?1. Non-recurrent unilateral inguinal hernia without obstruction or gangrene [K40.90]   ?Discussed the risk of surgery including recurrence, which can be up to 50% in the case of incisional or complex hernias, possible use of prosthetic materials (mesh) and the increased risk of mesh infxn if used, bleeding, chronic pain, post-op infxn, post-op SBO or ileus, and possible re-operation to address said risks. The risks of general anesthetic, if used, includes MI, CVA, sudden death or even reaction to anesthetic medications also discussed. Alternatives include continued observation.  Benefits include possible symptom relief, prevention of incarceration, strangulation, enlargement in size over time, and the risk of emergency surgery in the face of strangulation.  ? ?Typical post-op recovery time of 3-5 days with 2 weeks of activity restrictions were also discussed. ? ?ED return precautions given for sudden increase in pain, size of hernia with accompanying fever, nausea, and/or vomiting. ? ?The patient verbalized understanding and all questions were answered to the patient's satisfaction. ? ? ?2. Patient has elected to proceed with surgical treatment. Procedure will be scheduled. Right, also likely left, robotic assisted laparoscopic ? ?labs/images/medications/previous chart entries reviewed personally and relevant changes/updates noted above. ? ? ? ?

## 2021-09-08 ENCOUNTER — Other Ambulatory Visit
Admission: RE | Admit: 2021-09-08 | Discharge: 2021-09-08 | Disposition: A | Discharge status: Home / Self Care | Payer: 59 | Source: Ambulatory Visit | Attending: Neurosurgery | Admitting: Neurosurgery

## 2021-09-08 MED ORDER — FAMOTIDINE 20 MG PO TABS: 20.0000 mg | ORAL_TABLET | Freq: Once | ORAL | Status: DC

## 2021-09-08 NOTE — Patient Instructions (Addendum)
Your procedure is scheduled on: 09/17/30 - Friday ?Report to the Registration Desk on the 1st floor of the [facility]. ?To find out your arrival time, please call [phone] between 1PM - 3PM on: 09/15/21 - Thursday ? ?REMEMBER: ?Instructions that are not followed completely may result in serious medical risk, up to and including death; or upon the discretion of your surgeon and anesthesiologist your surgery may need to be rescheduled. ? ?Do not eat food after midnight the night before surgery.  ?No gum chewing, lozengers or hard candies. ? ?You may however, drink CLEAR liquids up to 2 hours before you are scheduled to arrive for your surgery. Do not drink anything within 2 hours of your scheduled arrival time. ? ?Clear liquids include: ?- water  ?- apple juice without pulp ?- gatorade (not RED colors) ?- black coffee or tea (Do NOT add milk or creamers to the coffee or tea) ?Do NOT drink anything that is not on this list. ? ?TAKE THESE MEDICATIONS THE MORNING OF SURGERY WITH A SIP OF WATER: NONE ? ?One week prior to surgery: ?Stop Anti-inflammatories (NSAIDS) such as Advil, Aleve, Ibuprofen, Motrin, Naproxen, Naprosyn and Aspirin based products such as Excedrin, Goodys Powder, BC Powder. ? ?Stop ANY OVER THE COUNTER supplements until after surgery. ? ?You may however, continue to take Tylenol if needed for pain up until the day of surgery. ? ?No Alcohol for 24 hours before or after surgery. ? ?No Smoking including e-cigarettes for 24 hours prior to surgery.  ?No chewable tobacco products for at least 6 hours prior to surgery.  ?No nicotine patches on the day of surgery. ? ?Do not use any "recreational" drugs for at least a week prior to your surgery.  ?Please be advised that the combination of cocaine and anesthesia may have negative outcomes, up to and including death. ?If you test positive for cocaine, your surgery will be cancelled. ? ?On the morning of surgery brush your teeth with toothpaste and water,  you may rinse your mouth with mouthwash if you wish. ?Do not swallow any toothpaste or mouthwash. ? ?Use CHG Soap or wipes as directed on instruction sheet. ? ?Do not wear jewelry, make-up, hairpins, clips or nail polish. ? ?Do not wear lotions, powders, or perfumes.  ? ?Do not shave body from the neck down 48 hours prior to surgery just in case you cut yourself which could leave a site for infection.  ?Also, freshly shaved skin may become irritated if using the CHG soap. ? ?Contact lenses, hearing aids and dentures may not be worn into surgery. ? ?Do not bring valuables to the hospital. [facility] is not responsible for any missing/lost belongings or valuables.  ? ?Notify your doctor if there is any change in your medical condition (cold, fever, infection). ? ?Wear comfortable clothing (specific to your surgery type) to the hospital. ? ?After surgery, you can help prevent lung complications by doing breathing exercises.  ?Take deep breaths and cough every 1-2 hours. Your doctor may order a device called an Incentive Spirometer to help you take deep breaths. ?When coughing or sneezing, hold a pillow firmly against your incision with both hands. This is called ?splinting.? Doing this helps protect your incision. It also decreases belly discomfort. ? ?If you are being admitted to the hospital overnight, leave your suitcase in the car. ?After surgery it may be brought to your room. ? ?If you are being discharged the day of surgery, you will not be allowed to drive  home. ?You will need a responsible adult (18 years or older) to drive you home and stay with you that night.  ? ?If you are taking public transportation, you will need to have a responsible adult (18 years or older) with you. ?Please confirm with your physician that it is acceptable to use public transportation.  ? ?Please call the Pre-admissions Testing Dept. at [phone] if you have any questions about these instructions. ? ?Surgery Visitation  Policy: ? ?Patients undergoing a surgery or procedure may have two family members or support persons with them as long as the person is not COVID-19 positive or experiencing its symptoms.  ? ?Inpatient Visitation:   ? ?Visiting hours are 7 a.m. to 8 p.m. ?Up to four visitors are allowed at one time in a patient room, including children. The visitors may rotate out with other people during the day. One designated support person (adult) may remain overnight. ? ?All Areas: ?All visitors must pass COVID-19 screenings, use hand sanitizer when entering and exiting the patient?s room and wear a mask at all times, including in the patient?s room. ?Patients must also wear a mask when staff or their visitor are in the room. ?Masking is required regardless of vaccination status.  ?

## 2021-09-15 MED ORDER — CELECOXIB 200 MG PO CAPS: 200.0000 mg | ORAL_CAPSULE | ORAL | Status: AC

## 2021-09-15 MED ORDER — CHLORHEXIDINE GLUCONATE CLOTH 2 % EX PADS: 6.0000 | MEDICATED_PAD | Freq: Once | CUTANEOUS | Status: DC

## 2021-09-15 MED ORDER — CEFAZOLIN SODIUM-DEXTROSE 2-4 GM/100ML-% IV SOLN
2.0000 g | INTRAVENOUS | Status: AC
  Administered 2021-09-16: 2 g via INTRAVENOUS

## 2021-09-15 MED ORDER — LACTATED RINGERS IV SOLN: INTRAVENOUS | Status: DC

## 2021-09-15 MED ORDER — CHLORHEXIDINE GLUCONATE 0.12 % MT SOLN: 15.0000 mL | Freq: Once | OROMUCOSAL | Status: AC

## 2021-09-15 MED ORDER — ORAL CARE MOUTH RINSE: 15.0000 mL | Freq: Once | OROMUCOSAL | Status: AC

## 2021-09-15 MED ORDER — ACETAMINOPHEN 500 MG PO TABS: 1000.0000 mg | ORAL_TABLET | ORAL | Status: AC

## 2021-09-15 MED ORDER — GABAPENTIN 300 MG PO CAPS: 300.0000 mg | ORAL_CAPSULE | ORAL | Status: AC

## 2021-09-16 ENCOUNTER — Other Ambulatory Visit: Payer: Self-pay

## 2021-09-16 ENCOUNTER — Ambulatory Visit
Admission: RE | Admit: 2021-09-16 | Discharge: 2021-09-16 | Disposition: A | Discharge status: Home / Self Care | Payer: 59 | Source: Ambulatory Visit | Attending: Surgery | Admitting: Surgery

## 2021-09-16 ENCOUNTER — Ambulatory Visit: Payer: 59 | Admitting: *Deleted

## 2021-09-16 ENCOUNTER — Ambulatory Visit: Payer: 59 | Admitting: Certified Registered Nurse Anesthetist

## 2021-09-16 ENCOUNTER — Encounter
Admission: RE | Disposition: A | Discharge status: Home / Self Care | Payer: Self-pay | Source: Ambulatory Visit | Attending: Surgery

## 2021-09-16 ENCOUNTER — Encounter: Payer: Self-pay | Admitting: Surgery

## 2021-09-16 DIAGNOSIS — K4021 Bilateral inguinal hernia, without obstruction or gangrene, recurrent: Secondary | ICD-10-CM

## 2021-09-16 DIAGNOSIS — M1990 Unspecified osteoarthritis, unspecified site: Secondary | ICD-10-CM | POA: Diagnosis not present

## 2021-09-16 DIAGNOSIS — D649 Anemia, unspecified: Secondary | ICD-10-CM | POA: Insufficient documentation

## 2021-09-16 DIAGNOSIS — K4090 Unilateral inguinal hernia, without obstruction or gangrene, not specified as recurrent: Secondary | ICD-10-CM | POA: Insufficient documentation

## 2021-09-16 DIAGNOSIS — K4091 Unilateral inguinal hernia, without obstruction or gangrene, recurrent: Secondary | ICD-10-CM | POA: Insufficient documentation

## 2021-09-16 HISTORY — PX: INSERTION OF MESH: SHX5868

## 2021-09-16 SURGERY — REPAIR, HERNIA, INGUINAL, BILATERAL, ROBOT-ASSISTED
Anesthesia: General | Site: Groin | Laterality: Bilateral

## 2021-09-16 MED ORDER — ONDANSETRON HCL 4 MG/2ML IJ SOLN
INTRAMUSCULAR | Status: DC | PRN
  Administered 2021-09-16: 4 mg via INTRAVENOUS

## 2021-09-16 MED ORDER — LACTATED RINGERS IV SOLN: INTRAVENOUS | Status: DC

## 2021-09-16 MED ORDER — PHENYLEPHRINE 40 MCG/ML (10ML) SYRINGE FOR IV PUSH (FOR BLOOD PRESSURE SUPPORT)
PREFILLED_SYRINGE | INTRAVENOUS | Status: DC | PRN
  Administered 2021-09-16: 80 ug via INTRAVENOUS

## 2021-09-16 MED ORDER — DEXAMETHASONE SODIUM PHOSPHATE 10 MG/ML IJ SOLN
INTRAMUSCULAR | Status: DC | PRN
  Administered 2021-09-16: 10 mg via INTRAVENOUS

## 2021-09-16 MED ORDER — ONDANSETRON HCL 4 MG/2ML IJ SOLN: 4.0000 mg | Freq: Once | INTRAMUSCULAR | Status: DC | PRN

## 2021-09-16 MED ORDER — SUGAMMADEX SODIUM 200 MG/2ML IV SOLN
INTRAVENOUS | Status: DC | PRN
  Administered 2021-09-16: 200 mg via INTRAVENOUS

## 2021-09-16 MED ORDER — FENTANYL CITRATE (PF) 100 MCG/2ML IJ SOLN
INTRAMUSCULAR | Status: AC
  Filled 2021-09-16: qty 2

## 2021-09-16 MED ORDER — CHLORHEXIDINE GLUCONATE 0.12 % MT SOLN
OROMUCOSAL | Status: AC
  Administered 2021-09-16: 15 mL via OROMUCOSAL
  Filled 2021-09-16: qty 15

## 2021-09-16 MED ORDER — CELECOXIB 200 MG PO CAPS
ORAL_CAPSULE | ORAL | Status: AC
  Administered 2021-09-16: 200 mg via ORAL
  Filled 2021-09-16: qty 1

## 2021-09-16 MED ORDER — OXYCODONE HCL 5 MG PO TABS: 5.0000 mg | ORAL_TABLET | Freq: Once | ORAL | Status: DC | PRN

## 2021-09-16 MED ORDER — FENTANYL CITRATE (PF) 100 MCG/2ML IJ SOLN
INTRAMUSCULAR | Status: DC | PRN
  Administered 2021-09-16 (×2): 50 ug via INTRAVENOUS

## 2021-09-16 MED ORDER — BUPIVACAINE LIPOSOME 1.3 % IJ SUSP
INTRAMUSCULAR | Status: DC | PRN
  Administered 2021-09-16: 20 mL

## 2021-09-16 MED ORDER — ACETAMINOPHEN 10 MG/ML IV SOLN: 1000.0000 mg | Freq: Once | INTRAVENOUS | Status: DC | PRN

## 2021-09-16 MED ORDER — BUPIVACAINE LIPOSOME 1.3 % IJ SUSP
INTRAMUSCULAR | Status: AC
  Filled 2021-09-16: qty 20

## 2021-09-16 MED ORDER — ACETAMINOPHEN 325 MG PO TABS
650.0000 mg | ORAL_TABLET | Freq: Three times a day (TID) | ORAL | 0 refills | Status: AC | PRN
Start: 2021-09-16 — End: 2021-10-16
  Filled 2021-09-16: qty 40, 7d supply, fill #0

## 2021-09-16 MED ORDER — BUPIVACAINE-EPINEPHRINE (PF) 0.5% -1:200000 IJ SOLN
INTRAMUSCULAR | Status: AC
  Filled 2021-09-16: qty 30

## 2021-09-16 MED ORDER — FENTANYL CITRATE (PF) 100 MCG/2ML IJ SOLN: 25.0000 ug | INTRAMUSCULAR | Status: DC | PRN

## 2021-09-16 MED ORDER — ACETAMINOPHEN 500 MG PO TABS
ORAL_TABLET | ORAL | Status: AC
  Administered 2021-09-16: 1000 mg via ORAL
  Filled 2021-09-16: qty 2

## 2021-09-16 MED ORDER — FAMOTIDINE 20 MG PO TABS
ORAL_TABLET | ORAL | Status: AC
  Administered 2021-09-16: 20 mg
  Filled 2021-09-16: qty 1

## 2021-09-16 MED ORDER — DEXMEDETOMIDINE (PRECEDEX) IN NS 20 MCG/5ML (4 MCG/ML) IV SYRINGE
PREFILLED_SYRINGE | INTRAVENOUS | Status: DC | PRN
  Administered 2021-09-16: 4 ug via INTRAVENOUS

## 2021-09-16 MED ORDER — GABAPENTIN 300 MG PO CAPS
ORAL_CAPSULE | ORAL | Status: AC
  Administered 2021-09-16: 300 mg via ORAL
  Filled 2021-09-16: qty 1

## 2021-09-16 MED ORDER — PROPOFOL 10 MG/ML IV BOLUS
INTRAVENOUS | Status: DC | PRN
  Administered 2021-09-16: 200 mg via INTRAVENOUS

## 2021-09-16 MED ORDER — OXYCODONE-ACETAMINOPHEN 5-325 MG PO TABS
1.0000 | ORAL_TABLET | Freq: Three times a day (TID) | ORAL | 0 refills | Status: AC | PRN
  Filled 2021-09-16: qty 6, 2d supply, fill #0

## 2021-09-16 MED ORDER — ROCURONIUM BROMIDE 100 MG/10ML IV SOLN
INTRAVENOUS | Status: DC | PRN
  Administered 2021-09-16: 20 mg via INTRAVENOUS
  Administered 2021-09-16: 40 mg via INTRAVENOUS

## 2021-09-16 MED ORDER — CEFAZOLIN SODIUM-DEXTROSE 2-4 GM/100ML-% IV SOLN
INTRAVENOUS | Status: AC
  Filled 2021-09-16: qty 100

## 2021-09-16 MED ORDER — BUPIVACAINE-EPINEPHRINE 0.5% -1:200000 IJ SOLN
INTRAMUSCULAR | Status: DC | PRN
  Administered 2021-09-16: 19 mL

## 2021-09-16 MED ORDER — IBUPROFEN 800 MG PO TABS
800.0000 mg | ORAL_TABLET | Freq: Three times a day (TID) | ORAL | 0 refills | Status: DC | PRN
  Filled 2021-09-16: qty 30, 10d supply, fill #0

## 2021-09-16 MED ORDER — OXYCODONE HCL 5 MG/5ML PO SOLN: 5.0000 mg | Freq: Once | ORAL | Status: DC | PRN

## 2021-09-16 MED ORDER — DOCUSATE SODIUM 100 MG PO CAPS
100.0000 mg | ORAL_CAPSULE | Freq: Two times a day (BID) | ORAL | 0 refills | Status: AC | PRN
  Filled 2021-09-16: qty 20, 10d supply, fill #0

## 2021-09-16 MED ORDER — LIDOCAINE HCL (CARDIAC) PF 100 MG/5ML IV SOSY
PREFILLED_SYRINGE | INTRAVENOUS | Status: DC | PRN
  Administered 2021-09-16: 100 mg via INTRAVENOUS

## 2021-09-16 SURGICAL SUPPLY — 50 items
BAG INFUSER PRESSURE 100CC (MISCELLANEOUS) IMPLANT
BLADE SURG SZ11 CARB STEEL (BLADE) ×3 IMPLANT
BNDG GAUZE ELAST 4 BULKY (GAUZE/BANDAGES/DRESSINGS) ×3 IMPLANT
COVER TIP SHEARS 8 DVNC (MISCELLANEOUS) ×2 IMPLANT
COVER TIP SHEARS 8MM DA VINCI (MISCELLANEOUS) ×1
DERMABOND ADVANCED (GAUZE/BANDAGES/DRESSINGS) ×1
DERMABOND ADVANCED .7 DNX12 (GAUZE/BANDAGES/DRESSINGS) ×2 IMPLANT
DRAPE ARM DVNC X/XI (DISPOSABLE) ×6 IMPLANT
DRAPE COLUMN DVNC XI (DISPOSABLE) ×2 IMPLANT
DRAPE DA VINCI XI ARM (DISPOSABLE) ×3
DRAPE DA VINCI XI COLUMN (DISPOSABLE) ×1
ELECT CAUTERY BLADE 6.4 (BLADE) IMPLANT
ELECT REM PT RETURN 9FT ADLT (ELECTROSURGICAL) ×3
ELECTRODE REM PT RTRN 9FT ADLT (ELECTROSURGICAL) ×2 IMPLANT
GLOVE SURG SYN 6.5 ES PF (GLOVE) ×12 IMPLANT
GLOVE SURG SYN 6.5 PF PI (GLOVE) ×4 IMPLANT
GLOVE SURG UNDER POLY LF SZ7 (GLOVE) ×8 IMPLANT
GOWN STRL REUS W/ TWL LRG LVL3 (GOWN DISPOSABLE) ×6 IMPLANT
GOWN STRL REUS W/TWL LRG LVL3 (GOWN DISPOSABLE) ×4
IRRIGATOR SUCT 8 DISP DVNC XI (IRRIGATION / IRRIGATOR) IMPLANT
IRRIGATOR SUCTION 8MM XI DISP (IRRIGATION / IRRIGATOR)
IV NS 1000ML (IV SOLUTION)
IV NS 1000ML BAXH (IV SOLUTION) IMPLANT
LABEL OR SOLS (LABEL) IMPLANT
MANIFOLD NEPTUNE II (INSTRUMENTS) ×3 IMPLANT
MESH 3DMAX 4X6 LT LRG (Mesh General) ×1 IMPLANT
MESH 3DMAX 4X6 RT LRG (Mesh General) ×1 IMPLANT
MESH 3DMAX MID 4X6 LT LRG (Mesh General) IMPLANT
MESH 3DMAX MID 4X6 RT LRG (Mesh General) IMPLANT
NDL INSUFFLATION 14GA 120MM (NEEDLE) ×2 IMPLANT
NEEDLE HYPO 22GX1.5 SAFETY (NEEDLE) ×3 IMPLANT
NEEDLE INSUFFLATION 14GA 120MM (NEEDLE) ×3 IMPLANT
OBTURATOR OPTICAL STANDARD 8MM (TROCAR) ×1
OBTURATOR OPTICAL STND 8 DVNC (TROCAR) ×2
OBTURATOR OPTICALSTD 8 DVNC (TROCAR) ×2 IMPLANT
PACK LAP CHOLECYSTECTOMY (MISCELLANEOUS) ×3 IMPLANT
PENCIL ELECTRO HAND CTR (MISCELLANEOUS) ×3 IMPLANT
SEAL CANN UNIV 5-8 DVNC XI (MISCELLANEOUS) ×6 IMPLANT
SEAL XI 5MM-8MM UNIVERSAL (MISCELLANEOUS) ×3
SET TUBE SMOKE EVAC HIGH FLOW (TUBING) ×3 IMPLANT
SOLUTION ELECTROLUBE (MISCELLANEOUS) ×3 IMPLANT
SUT MNCRL 4-0 (SUTURE) ×1
SUT MNCRL 4-0 27XMFL (SUTURE) ×2
SUT VIC AB 2-0 SH 27 (SUTURE) ×2
SUT VIC AB 2-0 SH 27XBRD (SUTURE) ×2 IMPLANT
SUT VLOC 90 6 CV-15 VIOLET (SUTURE) ×6 IMPLANT
SUTURE MNCRL 4-0 27XMF (SUTURE) ×2 IMPLANT
SYR 30ML LL (SYRINGE) ×3 IMPLANT
TAPE TRANSPORE STRL 2 31045 (GAUZE/BANDAGES/DRESSINGS) ×3 IMPLANT
WATER STERILE IRR 500ML POUR (IV SOLUTION) ×2 IMPLANT

## 2021-09-16 NOTE — Progress Notes (Signed)
Patient refused to allow me to give instructions to his friend. He stated that he does not need anyone to get his instructions. He also refused to go home he said he needed to go to a meeting from 12-5 and did not want me to take him in a wheelchair so I did not release him to anyone. Patient stated he is okay to care for himself. ?

## 2021-09-16 NOTE — Anesthesia Preprocedure Evaluation (Signed)
Anesthesia Evaluation  ?Patient identified by MRN, date of birth, ID band ?Patient awake ? ? ? ?Reviewed: ?Allergy & Precautions, NPO status , Patient's Chart, lab work & pertinent test results ? ?History of Anesthesia Complications ?Negative for: history of anesthetic complications ? ?Airway ?Mallampati: I ? ? ?Neck ROM: Full ? ? ? Dental ?no notable dental hx. ? ?  ?Pulmonary ?neg pulmonary ROS,  ?  ?Pulmonary exam normal ?breath sounds clear to auscultation ? ? ? ? ? ? Cardiovascular ?Exercise Tolerance: Good ?Normal cardiovascular exam ?Rhythm:Regular Rate:Normal ? ? ?  ?Neuro/Psych ?negative neurological ROS ?   ? GI/Hepatic ?negative GI ROS,   ?Endo/Other  ?negative endocrine ROS ? Renal/GU ?negative Renal ROS  ? ?  ?Musculoskeletal ? ?(+) Arthritis ,  ? Abdominal ?  ?Peds ? Hematology ? ?(+) Blood dyscrasia, anemia ,   ?Anesthesia Other Findings ? ? Reproductive/Obstetrics ? ?  ? ? ? ? ? ? ? ? ? ? ? ? ? ?  ?  ? ? ? ? ? ? ? ? ?Anesthesia Physical ?Anesthesia Plan ? ?ASA: 2 ? ?Anesthesia Plan: General  ? ?Post-op Pain Management:   ? ?Induction: Intravenous ? ?PONV Risk Score and Plan: 2 and Ondansetron, Dexamethasone and Treatment may vary due to age or medical condition ? ?Airway Management Planned: Oral ETT ? ?Additional Equipment:  ? ?Intra-op Plan:  ? ?Post-operative Plan: Extubation in OR ? ?Informed Consent: I have reviewed the patients History and Physical, chart, labs and discussed the procedure including the risks, benefits and alternatives for the proposed anesthesia with the patient or authorized representative who has indicated his/her understanding and acceptance.  ? ? ? ?Dental advisory given ? ?Plan Discussed with: CRNA ? ?Anesthesia Plan Comments: (Patient consented for risks of anesthesia including but not limited to:  ?- adverse reactions to medications ?- damage to eyes, teeth, lips or other oral mucosa ?- nerve damage due to positioning  ?- sore throat or  hoarseness ?- damage to heart, brain, nerves, lungs, other parts of body or loss of life ? ?Informed patient about role of CRNA in peri- and intra-operative care.  Patient voiced understanding.)  ? ? ? ? ? ? ?Anesthesia Quick Evaluation ? ?

## 2021-09-16 NOTE — Anesthesia Procedure Notes (Signed)
Procedure Name: Intubation ?Date/Time: 09/16/2021 9:07 AM ?Performed by: Joanette Gula, Lakshya Mcgillicuddy, CRNA ?Pre-anesthesia Checklist: Patient identified, Emergency Drugs available, Suction available and Patient being monitored ?Patient Re-evaluated:Patient Re-evaluated prior to induction ?Oxygen Delivery Method: Circle system utilized ?Preoxygenation: Pre-oxygenation with 100% oxygen ?Induction Type: IV induction ?Ventilation: Mask ventilation without difficulty ?Laryngoscope Size: McGraph and 4 ?Grade View: Grade I ?Tube type: Oral ?Number of attempts: 1 ?Airway Equipment and Method: Stylet ?Placement Confirmation: ETT inserted through vocal cords under direct vision, positive ETCO2 and breath sounds checked- equal and bilateral ?Secured at: 22 cm ?Tube secured with: Tape ?Dental Injury: Teeth and Oropharynx as per pre-operative assessment  ? ? ? ? ?

## 2021-09-16 NOTE — Anesthesia Postprocedure Evaluation (Signed)
Anesthesia Post Note ? ?Patient: Adam [name] ? ?Procedure(s) Performed: XI ROBOTIC ASSISTED BILATERAL INGUINAL HERNIA (Bilateral: Groin) ?INSERTION OF MESH ? ?Patient location during evaluation: PACU ?Anesthesia Type: General ?Level of consciousness: awake and alert, oriented and patient cooperative ?Pain management: pain level controlled ?Vital Signs Assessment: post-procedure vital signs reviewed and stable ?Respiratory status: spontaneous breathing, nonlabored ventilation and respiratory function stable ?Cardiovascular status: blood pressure returned to baseline and stable ?Postop Assessment: adequate PO intake ?Anesthetic complications: no ? ? ?No notable events documented. ? ? ?Last Vitals:  ?Vitals:  ? 09/16/21 1145 09/16/21 1159  ?BP: (!) 126/94 (!) 131/96  ?Pulse: (!) 58 63  ?Resp: 16 18  ?Temp:  (!) 36.1 ?C  ?SpO2: 99% 98%  ?  ?Last Pain:  ?Vitals:  ? 09/16/21 1159  ?TempSrc: Temporal  ?PainSc: 2   ? ? ?  ?  ?  ?  ?  ?  ? ?Reed Breech ? ? ? ? ?

## 2021-09-16 NOTE — Op Note (Addendum)
Preoperative diagnosis: BILATERAL inguinal Hernia, initial on right, recurrent on left, reducible ? ?Postoperative diagnosis: same ? ?Procedure: Robotic assisted laparoscopic bilateral inguinal hernia repair with mesh ? ?Anesthesia: General ? ?Surgeon: Dr. Tonna Boehringer ? ?Wound Classification: Clean ? ?Specimen: none ? ?Complications: None ? ?Estimated Blood Loss: 74mL ? ? ?Indications:  inguinal hernia. Repair was indicated to avoid complications of incarceration, obstruction and pain, and a prosthetic mesh repair was elected.  See H&P for further details. ? ?Findings: ?1. Vas Deferens and cord structures identified and preserved ?2. Bard 3D max medium weight mesh used for repair ?3. Adequate hemostasis achieved ? ?Description of procedure: ?The patient was taken to the operating room. A time-out was completed verifying correct patient, procedure, site, positioning, and implant(s) and/or special equipment prior to beginning this procedure.  Area was prepped and draped in the usual sterile fashion. An incision was marked 20 cm above the pubic tubercle, slightly above the umbilicus  Scrotum wrapped in Kerlix roll. ? ?Veress needle inserted at palmer's point.  Saline drop test noted to be positive with gradual increase in pressure after initiation of gas insufflation.  15 mm of pressure was achieved prior to removing the Veress needle and then placing a 8 mm port via the Optiview technique through the supraumbilical site.  Inspection of the area afterwards noted no injury to the surrounding organs during insertion of the needle and the port.  2 port sites were marked 8 cm to the lateral sides of the initial port, and a 8 mm robotic port was placed on the left side, another 8 mm robotic port on the right side under direct supervision.  Local anesthesia  infused to the preplanned incision sites prior to insertion of the port.  The [employer] platform was then brought into the operative field and docked to the  ports. ? ?Examination of the abdominal cavity noted a bilateral inguinal hernia.  Right side addressed first. A peritoneal flap was created approximately 8cm cephalad to the defect by using scissors with electrocautery.  Dissection was carried down towards the pubic tubercle, developing the myopectineal orifice view.  Laterally the flap was carried towards the ASIS.  Small hernia sac was noted, which carefully dissected away from the adjacent tissues to be fully reduced out of hernia cavity, along with adjacent cord lipoma.  Any bleeding was controlled with combination of electrocautery and manual pressure.   ? ?After confirming adequate dissection and the peritoneal reflection completely down and away from the cord structures, a Large Bard 3DMax medium weight mesh was placed within the anterior abdominal wall, secured in place using 2-0 Vicryl on an SH needle immediately above the pubic tubercle.  After noting proper placement of the mesh with the peritoneal reflection deep to it, the previously created peritoneal flap was secured back up to the anterior abdominal wall using running 3-0 V-Lock.  Both needles were then removed out of the abdominal cavity. ? ?Left side addressed next. A peritoneal flap was created approximately 8cm cephalad to the defect by using scissors with electrocautery.  Dissection was carried down towards the pubic tubercle, developing the myopectineal orifice view.  Laterally the flap was carried towards the ASIS.  Very small hernia sac was noted, which carefully dissected away from the adjacent tissues to be fully reduced out of hernia cavity.  Any bleeding was controlled with combination of electrocautery and manual pressure.   ? ?After confirming adequate dissection and the peritoneal reflection completely down and away from the cord structures,  a Large Bard 3DMax medium weight mesh was placed within the anterior abdominal wall, secured in place using 2-0 Vicryl on an SH needle immediately  above the pubic tubercle.  After noting proper placement of the mesh with the peritoneal reflection deep to it, the previously created peritoneal flap was secured back up to the anterior abdominal wall using running 3-0 V-Lock.  Both needles were then removed out of the abdominal cavity Xi platform undocked from the ports and removed off of operative field.  exparel infused as ilioinguinal block. ? ?Abdomen then desufflated and ports removed. All the skin incisions were then closed with a subcuticular stitch of Monocryl 4-0. Dermabond was applied. The testis was gently pulled down into its anatomic position in the scrotum.  The patient tolerated the procedure well and was taken to the postanesthesia care unit in stable condition. Sponge and instrument count correct at end of procedure. ? ?

## 2021-09-16 NOTE — Interval H&P Note (Signed)
History and Physical Interval Note: ? ?09/16/2021 ?11:04 AM ? ?Adam [name]  has presented today for surgery, with the diagnosis of Non-recurrent unilateral inguinal hernia without obstruction or gangrene K40.90.  The various methods of treatment have been discussed with the patient and family. After consideration of risks, benefits and other options for treatment, the patient has consented to  Procedure(s): ?XI ROBOTIC ASSISTED BILATERAL INGUINAL HERNIA (Bilateral) ?INSERTION OF MESH as a surgical intervention.  The patient's history has been reviewed, patient examined, no change in status, stable for surgery.  I have reviewed the patient's chart and labs.  Questions were answered to the patient's satisfaction.   ? ? ?Adam [name] [name] ? ? ?

## 2021-09-16 NOTE — Discharge Instructions (Addendum)
Hernia repair, Care After ?This sheet gives you information about how to care for yourself after your procedure. Your health care provider may also give you more specific instructions. If you have problems or questions, contact your health care provider. ?What can I expect after the procedure? ?After your procedure, it is common to have the following: ?Pain in your abdomen, especially in the incision areas. You will be given medicine to control the pain. ?Tiredness. This is a normal part of the recovery process. Your energy level will return to normal over the next several weeks. ?Changes in your bowel movements, such as constipation or needing to go more often. Talk with your health care provider about how to manage this. ?Follow these instructions at home: ?Medicines ?RESUME ASPIRIN IN 48HRS  ?tylenol and advil as needed for discomfort.  Please alternate between the two every four hours as needed for pain.   ? Use narcotics, if prescribed, only when tylenol and motrin is not enough to control pain. ? 325-650mg  every 8hrs to max of 3000mg /24hrs (including the 325mg  in every norco dose) for the tylenol.   ? Advil up to 800mg  per dose every 8hrs as needed for pain.   ?PLEASE RECORD NUMBER OF PILLS TAKEN UNTIL NEXT FOLLOW UP APPT.  THIS WILL HELP DETERMINE HOW READY YOU ARE TO BE RELEASED FROM ANY ACTIVITY RESTRICTIONS ?Do not drive or use heavy machinery while taking prescription pain medicine. ?Do not drink alcohol while taking prescription pain medicine. ? ?Incision care ? ?  ?Follow instructions from your health care provider about how to take care of your incision areas. Make sure you: ?Keep your incisions clean and dry. ?Wash your hands with soap and water before and after applying medicine to the areas, and before and after changing your bandage (dressing). If soap and water are not available, use hand sanitizer. ?Change your dressing as told by your health care provider. ?Leave stitches (sutures), skin glue,  or adhesive strips in place. These skin closures may need to stay in place for 2 weeks or longer. If adhesive strip edges start to loosen and curl up, you may trim the loose edges. Do not remove adhesive strips completely unless your health care provider tells you to do that. ?Do not wear tight clothing over the incisions. Tight clothing may rub and irritate the incision areas, which may cause the incisions to open. ?Do not take baths, swim, or use a hot tub until your health care provider approves. OK TO SHOWER IN 24HRS.   ?Check your incision area every day for signs of infection. Check for: ?More redness, swelling, or pain. ?More fluid or blood. ?Warmth. ?Pus or a bad smell. ?Activity ?Avoid lifting anything that is heavier than 10 lb (4.5 kg) for 2 weeks or until your health care provider says it is okay. ?No pushing/pulling greater than 30lbs ?You may resume normal activities as told by your health care provider. Ask your health care provider what activities are safe for you. ?Take rest breaks during the day as needed. ?Eating and drinking ?Follow instructions from your health care provider about what you can eat after surgery. ?To prevent or treat constipation while you are taking prescription pain medicine, your health care provider may recommend that you: ?Drink enough fluid to keep your urine clear or pale yellow. ?Take over-the-counter or prescription medicines. ?Eat foods that are high in fiber, such as fresh fruits and vegetables, whole grains, and beans. ?Limit foods that are high in fat and processed sugars,  such as fried and sweet foods. ?General instructions ?Ask your health care provider when you will need an appointment to get your sutures or staples removed. ?Keep all follow-up visits as told by your health care provider. This is important. ?Contact a health care provider if: ?You have more redness, swelling, or pain around your incisions. ?You have more fluid or blood coming from the  incisions. ?Your incisions feel warm to the touch. ?You have pus or a bad smell coming from your incisions or your dressing. ?You have a fever. ?You have an incision that breaks open (edges not staying together) after sutures or staples have been removed. ?You develop a rash. ?You have chest pain or difficulty breathing. ?You have pain or swelling in your legs. ?You feel light-headed or you faint. ?Your abdomen swells (becomes distended). ?You have nausea or vomiting. ?You have blood in your stool (feces). ?This information is not intended to replace advice given to you by your health care provider. Make sure you discuss any questions you have with your health care provider. ?Document Released: 12/23/2004 Document Revised: 02/22/2018 Document Reviewed: 03/06/2016 ?Elsevier Interactive Patient Education ? 2019 [employer] ?  ? ?AMBULATORY SURGERY  ?DISCHARGE INSTRUCTIONS ? ? ?The drugs that you were given will stay in your system until tomorrow so for the next 24 hours you should not: ? ?Drive an automobile ?Make any legal decisions ?Drink any alcoholic beverage ? ? ?You may resume regular meals tomorrow.  Today it is better to start with liquids and gradually work up to solid foods. ? ?You may eat anything you prefer, but it is better to start with liquids, then soup and crackers, and gradually work up to solid foods. ? ? ?Please notify your doctor immediately if you have any unusual bleeding, trouble breathing, redness and pain at the surgery site, drainage, fever, or pain not relieved by medication. ? ? ? ?Additional Instructions: ? ? ? ? ? ? ? ?Please contact your physician with any problems or Same Day Surgery at [phone], Monday through Friday 6 am to 4 pm, or [place] [facility] number at [phone].  ? ?

## 2021-09-16 NOTE — Transfer of Care (Signed)
Immediate Anesthesia Transfer of Care Note ? ?Patient: Adam [name] ? ?Procedure(s) Performed: XI ROBOTIC ASSISTED BILATERAL INGUINAL HERNIA (Bilateral: Groin) ?INSERTION OF MESH ? ?Patient Location: PACU ? ?Anesthesia Type:General ? ?Level of Consciousness: drowsy ? ?Airway & Oxygen Therapy: Patient Spontanous Breathing and Patient connected to face mask oxygen ? ?Post-op Assessment: Report given to RN and Post -op Vital signs reviewed and stable ? ?Post vital signs: Reviewed and stable ? ?Last Vitals:  ?Vitals Value Taken Time  ?BP 116/84 09/16/21 1057  ?Temp 36.3 ?C 09/16/21 1057  ?Pulse 58 09/16/21 1058  ?Resp 13 09/16/21 1058  ?SpO2 99 % 09/16/21 1058  ?Vitals shown include unvalidated device data. ? ?Last Pain:  ?Vitals:  ? 09/16/21 1057  ?TempSrc:   ?PainSc: Asleep  ?   ? ?  ? ?Complications: No notable events documented. ?

## 2021-09-29 ENCOUNTER — Other Ambulatory Visit: Payer: Self-pay

## 2021-12-14 ENCOUNTER — Other Ambulatory Visit: Payer: Self-pay

## 2022-01-10 ENCOUNTER — Other Ambulatory Visit: Payer: Self-pay

## 2022-03-28 DIAGNOSIS — H11001 Unspecified pterygium of right eye: Secondary | ICD-10-CM | POA: Diagnosis not present

## 2022-03-28 DIAGNOSIS — H5213 Myopia, bilateral: Secondary | ICD-10-CM | POA: Diagnosis not present

## 2022-03-28 DIAGNOSIS — H35419 Lattice degeneration of retina, unspecified eye: Secondary | ICD-10-CM | POA: Diagnosis not present

## 2022-05-09 DIAGNOSIS — M519 Unspecified thoracic, thoracolumbar and lumbosacral intervertebral disc disorder: Secondary | ICD-10-CM | POA: Diagnosis not present

## 2022-05-09 DIAGNOSIS — E785 Hyperlipidemia, unspecified: Secondary | ICD-10-CM | POA: Diagnosis not present

## 2022-05-09 DIAGNOSIS — Z23 Encounter for immunization: Secondary | ICD-10-CM | POA: Diagnosis not present

## 2022-05-09 DIAGNOSIS — Z0000 Encounter for general adult medical examination without abnormal findings: Secondary | ICD-10-CM | POA: Diagnosis not present

## 2022-06-09 ENCOUNTER — Other Ambulatory Visit: Payer: Self-pay

## 2022-07-19 DIAGNOSIS — E785 Hyperlipidemia, unspecified: Secondary | ICD-10-CM | POA: Diagnosis not present

## 2022-07-19 DIAGNOSIS — D649 Anemia, unspecified: Secondary | ICD-10-CM | POA: Diagnosis not present

## 2022-07-19 DIAGNOSIS — Z125 Encounter for screening for malignant neoplasm of prostate: Secondary | ICD-10-CM | POA: Diagnosis not present

## 2022-07-19 DIAGNOSIS — Z0000 Encounter for general adult medical examination without abnormal findings: Secondary | ICD-10-CM | POA: Diagnosis not present

## 2023-03-07 ENCOUNTER — Other Ambulatory Visit: Payer: Self-pay

## 2023-03-07 MED ORDER — TADALAFIL 10 MG PO TABS
10.0000 mg | ORAL_TABLET | Freq: Every day | ORAL | 11 refills | Status: AC | PRN
  Filled 2023-03-07: qty 6, 30d supply, fill #0

## 2023-03-19 ENCOUNTER — Other Ambulatory Visit: Payer: Self-pay

## 2023-05-11 DIAGNOSIS — Z1283 Encounter for screening for malignant neoplasm of skin: Secondary | ICD-10-CM | POA: Diagnosis not present

## 2023-05-11 DIAGNOSIS — D649 Anemia, unspecified: Secondary | ICD-10-CM | POA: Diagnosis not present

## 2023-05-11 DIAGNOSIS — Z1211 Encounter for screening for malignant neoplasm of colon: Secondary | ICD-10-CM | POA: Diagnosis not present

## 2023-05-11 DIAGNOSIS — E785 Hyperlipidemia, unspecified: Secondary | ICD-10-CM | POA: Diagnosis not present

## 2023-05-11 DIAGNOSIS — Z125 Encounter for screening for malignant neoplasm of prostate: Secondary | ICD-10-CM | POA: Diagnosis not present

## 2023-05-11 DIAGNOSIS — Z131 Encounter for screening for diabetes mellitus: Secondary | ICD-10-CM | POA: Diagnosis not present

## 2023-05-11 DIAGNOSIS — M519 Unspecified thoracic, thoracolumbar and lumbosacral intervertebral disc disorder: Secondary | ICD-10-CM | POA: Diagnosis not present

## 2023-05-11 DIAGNOSIS — Z0000 Encounter for general adult medical examination without abnormal findings: Secondary | ICD-10-CM | POA: Diagnosis not present

## 2023-05-16 ENCOUNTER — Other Ambulatory Visit: Payer: Self-pay

## 2023-05-16 MED ORDER — ROSUVASTATIN CALCIUM 10 MG PO TABS
10.0000 mg | ORAL_TABLET | Freq: Every day | ORAL | 3 refills | Status: AC
  Filled 2023-05-16: qty 90, 90d supply, fill #0
  Filled 2023-10-30: qty 90, 90d supply, fill #1
  Filled 2024-03-19: qty 90, 90d supply, fill #2

## 2023-05-28 ENCOUNTER — Encounter: Payer: Self-pay | Admitting: *Deleted

## 2023-10-30 ENCOUNTER — Other Ambulatory Visit: Payer: Self-pay

## 2024-02-12 ENCOUNTER — Other Ambulatory Visit: Payer: Self-pay

## 2024-02-12 ENCOUNTER — Telehealth: Payer: Self-pay

## 2024-02-12 DIAGNOSIS — Z860100 Personal history of colon polyps, unspecified: Secondary | ICD-10-CM

## 2024-02-12 MED ORDER — CLENPIQ 10-3.5-12 MG-GM -GM/175ML PO SOLN: 1.0000 | Freq: Once | ORAL | Status: AC

## 2024-02-12 NOTE — Telephone Encounter (Signed)
 Gastroenterology Pre-Procedure Review  Request Date: Sept 26, 2025 Requesting Physician: Dr. Jinny  PATIENT REVIEW QUESTIONS: The patient responded to the following health history questions as indicated:    1. Are you having any GI issues? no 2. Do you have a personal history of Polyps? Yes, previous 2019 3. Do you have a family history of Colon Cancer or Polyps? Grandfather colon cancer 4. Diabetes Mellitus? no 5. Joint replacements in the past 12 months?no 6. Major health problems in the past 3 months?no 7. Any artificial heart valves, MVP, or defibrillator?no    MEDICATIONS & ALLERGIES:    Patient reports the following regarding taking any anticoagulation/antiplatelet therapy:   Plavix, Coumadin, Eliquis, Xarelto, Lovenox, Pradaxa, Brilinta, or Effient? no Aspirin ? No  Patient confirms/reports the following medications:   Current Outpatient Medications  Medication Sig Dispense Refill   aspirin  81 MG chewable tablet Chew 1 tablet (81 mg total) by mouth 2 (two) times daily. (Patient taking differently: Chew 81 mg by mouth daily.) 28 tablet 0   ibuprofen  (ADVIL ,MOTRIN ) 600 MG tablet Take 400-600 mg by mouth every 6 (six) hours as needed for mild pain.     rosuvastatin  (CRESTOR ) 10 MG tablet Take 1 tablet (10 mg total) by mouth once daily 90 tablet 3   tadalafil  (CIALIS ) 10 MG tablet Take 1 tablet (10 mg total) by mouth daily as needed. Take dose 30-45 min prior to anticipated sexual activity. 10 tablet 11   No current facility-administered medications for this visit.   Patient states he has a prep given to him by Dr Jinny, Clenpiq   Patient confirms/reports the following allergies:  No Known Allergies  No orders of the defined types were placed in this encounter.   AUTHORIZATION INFORMATION Primary Insurance: [employer] 1D#: Group #:  Secondary Insurance: 1D#: Group #:  SCHEDULE INFORMATION: Date: 03/14/2024 Time: Location: [facility]

## 2024-02-12 NOTE — Telephone Encounter (Signed)
 Pt lvm requesting to schedule his colonoscopy with Dr. Jinny on 03/14/24.  Returned phone call.  LVM to make him aware that Dr. Jinny does not do procedures on Fridays.  He has availability on Mon, Tue, Thur in December at this time-unless there has been some cancellations.  Asked him to call the office back to let me know what his availability would be for the month of December if he would still like to schedule his colonoscopy with Dr. Jinny.  His referral is in Epic dated 05/15/23.  Thanks,  Polo, CMA

## 2024-02-12 NOTE — Addendum Note (Signed)
 Addended by: LANNIE ANDREA GRADE on: 02/12/2024 05:13 PM   Modules accepted: Orders

## 2024-03-11 DIAGNOSIS — H11001 Unspecified pterygium of right eye: Secondary | ICD-10-CM | POA: Diagnosis not present

## 2024-03-11 DIAGNOSIS — H25013 Cortical age-related cataract, bilateral: Secondary | ICD-10-CM | POA: Diagnosis not present

## 2024-03-11 DIAGNOSIS — H5213 Myopia, bilateral: Secondary | ICD-10-CM | POA: Diagnosis not present

## 2024-03-11 DIAGNOSIS — H35419 Lattice degeneration of retina, unspecified eye: Secondary | ICD-10-CM | POA: Diagnosis not present

## 2024-03-14 ENCOUNTER — Encounter
Admission: RE | Disposition: A | Discharge status: Home / Self Care | Payer: Self-pay | Source: Home / Self Care | Attending: Gastroenterology

## 2024-03-14 ENCOUNTER — Encounter: Payer: Self-pay | Admitting: Gastroenterology

## 2024-03-14 ENCOUNTER — Ambulatory Visit
Admission: RE | Admit: 2024-03-14 | Discharge: 2024-03-14 | Disposition: A | Discharge status: Home / Self Care | Attending: Gastroenterology | Admitting: Gastroenterology

## 2024-03-14 ENCOUNTER — Ambulatory Visit: Admitting: Registered Nurse

## 2024-03-14 DIAGNOSIS — D124 Benign neoplasm of descending colon: Secondary | ICD-10-CM | POA: Diagnosis not present

## 2024-03-14 DIAGNOSIS — Z1211 Encounter for screening for malignant neoplasm of colon: Secondary | ICD-10-CM | POA: Diagnosis not present

## 2024-03-14 DIAGNOSIS — K640 First degree hemorrhoids: Secondary | ICD-10-CM | POA: Diagnosis not present

## 2024-03-14 DIAGNOSIS — F32A Depression, unspecified: Secondary | ICD-10-CM | POA: Insufficient documentation

## 2024-03-14 DIAGNOSIS — E785 Hyperlipidemia, unspecified: Secondary | ICD-10-CM | POA: Diagnosis not present

## 2024-03-14 DIAGNOSIS — K5730 Diverticulosis of large intestine without perforation or abscess without bleeding: Secondary | ICD-10-CM

## 2024-03-14 DIAGNOSIS — Z860101 Personal history of adenomatous and serrated colon polyps: Secondary | ICD-10-CM

## 2024-03-14 DIAGNOSIS — K635 Polyp of colon: Secondary | ICD-10-CM | POA: Diagnosis not present

## 2024-03-14 DIAGNOSIS — Z860100 Personal history of colon polyps, unspecified: Secondary | ICD-10-CM

## 2024-03-14 HISTORY — PX: POLYPECTOMY: SHX149

## 2024-03-14 HISTORY — PX: COLONOSCOPY: SHX5424

## 2024-03-14 SURGERY — COLONOSCOPY
Anesthesia: General

## 2024-03-14 MED ORDER — LIDOCAINE HCL (PF) 2 % IJ SOLN
INTRAMUSCULAR | Status: AC
  Filled 2024-03-14: qty 5

## 2024-03-14 MED ORDER — LIDOCAINE HCL (CARDIAC) PF 100 MG/5ML IV SOSY
PREFILLED_SYRINGE | INTRAVENOUS | Status: DC | PRN
  Administered 2024-03-14: 100 mg via INTRAVENOUS

## 2024-03-14 MED ORDER — SODIUM CHLORIDE 0.9 % IV SOLN
INTRAVENOUS | Status: DC
  Administered 2024-03-14: 20 mL/h via INTRAVENOUS

## 2024-03-14 MED ORDER — PROPOFOL 1000 MG/100ML IV EMUL
INTRAVENOUS | Status: AC
  Filled 2024-03-14: qty 100

## 2024-03-14 MED ORDER — PROPOFOL 10 MG/ML IV BOLUS
INTRAVENOUS | Status: DC | PRN
  Administered 2024-03-14: 150 ug/kg/min via INTRAVENOUS
  Administered 2024-03-14: 100 mg via INTRAVENOUS

## 2024-03-14 NOTE — H&P (Addendum)
 Rogelia Copping, MD [facility] [address] [place], [place] [address] Phone:[phone] Fax : [phone]  Primary Care Physician:  Fernande Ophelia JINNY DOUGLAS, MD Primary Gastroenterologist:  Dr. Copping  Pre-Procedure History & Physical: HPI:  Adam [name] is a 63 y.o. male is here for an colonoscopy.   Past Medical History:  Diagnosis Date   Arthritis    osteoarthritis right hip   Hyperlipemia     Past Surgical History:  Procedure Laterality Date   COLONOSCOPY     INSERTION OF MESH  09/16/2021   Procedure: INSERTION OF MESH;  Surgeon: Tye Millet, DO;  Location: [facility] ORS;  Service: General;;   none     TOTAL HIP ARTHROPLASTY Right 07/30/2017   Procedure: TOTAL HIP ARTHROPLASTY ANTERIOR APPROACH;  Surgeon: Leora Lynwood SAUNDERS, MD;  Location: [facility] ORS;  Service: Orthopedics;  Laterality: Right;   TOTAL HIP ARTHROPLASTY Left 05/24/2018   Procedure: TOTAL HIP ARTHROPLASTY ANTERIOR APPROACH;  Surgeon: Leora Lynwood SAUNDERS, MD;  Location: [facility] ORS;  Service: Orthopedics;  Laterality: Left;    Prior to Admission medications   Medication Sig Start Date End Date Taking? Authorizing Provider  rosuvastatin  (CRESTOR ) 10 MG tablet Take 1 tablet (10 mg total) by mouth once daily 05/16/23  Yes   tadalafil  (CIALIS ) 10 MG tablet Take 1 tablet (10 mg total) by mouth daily as needed. Take dose 30-45 min prior to anticipated sexual activity. 03/07/23       Allergies as of 02/13/2024   (No Known Allergies)    Family History  Problem Relation Age of Onset   Breast cancer Mother    Hypercholesterolemia Father    Pancreatic cancer Maternal Grandfather    Heart disease Paternal Grandfather    Colon cancer Paternal Grandfather    Diabetes Paternal Grandfather    Heart disease Paternal Grandmother    Prostate cancer Neg Hx    Bladder Cancer Neg Hx    Kidney cancer Neg Hx     Social History   Socioeconomic History   Marital status: Married    Spouse name: Not on file   Number of children: Not on  file   Years of education: Not on file   Highest education level: Not on file  Occupational History   Not on file  Tobacco Use   Smoking status: Never   Smokeless tobacco: Never  Vaping Use   Vaping status: Never Used  Substance and Sexual Activity   Alcohol use: No   Drug use: No   Sexual activity: Not on file  Other Topics Concern   Not on file  Social History Narrative   Not on file   Social Drivers of Health   Financial Resource Strain: Low Risk  (05/11/2023)   Received from [facility]   Overall Financial Resource Strain (CARDIA)    Difficulty of Paying Living Expenses: Not hard at all  Food Insecurity: No Food Insecurity (05/11/2023)   Received from [facility]   Hunger Vital Sign    Within the past 12 months, you worried that your food would run out before you got the money to buy more.: Never true    Within the past 12 months, the food you bought just didn't last and you didn't have money to get more.: Never true  Transportation Needs: No Transportation Needs (05/11/2023)   Received from [facility] - Transportation    In the past 12 months, has lack of transportation kept  you from medical appointments or from getting medications?: No    Lack of Transportation (Non-Medical): No  Physical Activity: Not on file  Stress: Not on file  Social Connections: Not on file  Intimate Partner Violence: Not on file    Review of Systems: See HPI, otherwise negative ROS  Physical Exam: BP (!) 117/108   Pulse 74   Temp (!) 96 F (35.6 C) (Temporal)   Resp 20   Ht 5' 10 (1.778 m)   Wt 75.6 kg   SpO2 96%   BMI 23.90 kg/m  General:   Alert,  pleasant and cooperative in NAD Head:  Normocephalic and atraumatic. Neck:  Supple; no masses or thyromegaly. Lungs:  Clear throughout to auscultation.    Heart:  Regular rate and rhythm. Abdomen:  Soft, nontender and nondistended. Normal bowel sounds, without  guarding, and without rebound.   Neurologic:  Alert and  oriented x4;  grossly normal neurologically.  Impression/Plan: Adam [name] is here for an colonoscopy to be performed for a history of adenomatous polyps on 2016   Risks, benefits, limitations, and alternatives regarding  colonoscopy have been reviewed with the patient.  Questions have been answered.  All parties agreeable.   Rogelia Copping, MD  03/14/2024, 7:43 AM

## 2024-03-14 NOTE — Op Note (Signed)
 [facility] Gastroenterology Patient Name: Adam [name] Procedure Date: 03/14/2024 7:17 AM MRN: [id number] Account #: [id number] Date of Birth: 26-Sep-1960 Admit Type: Outpatient Age: 63 Room: [facility] ENDO ROOM 4 Gender: Male Note Status: Finalized Instrument Name: Colon Scope [id number] Procedure:             Colonoscopy Indications:           High risk colon cancer surveillance: Personal history                         of colonic polyps Providers:             Rogelia Copping MD, MD Referring MD:          Ophelia Sage, MD (Referring MD) Medicines:             Propofol  per Anesthesia Complications:         No immediate complications. Procedure:             Pre-Anesthesia Assessment:                        - Prior to the procedure, a History and Physical was                         performed, and patient medications and allergies were                         reviewed. The patient's tolerance of previous                         anesthesia was also reviewed. The risks and benefits                         of the procedure and the sedation options and risks                         were discussed with the patient. All questions were                         answered, and informed consent was obtained. Prior                         Anticoagulants: The patient has taken no anticoagulant                         or antiplatelet agents. ASA Grade Assessment: II - A                         patient with mild systemic disease. After reviewing                         the risks and benefits, the patient was deemed in                         satisfactory condition to undergo the procedure.                        After obtaining informed consent, the colonoscope was  passed under direct vision. Throughout the procedure,                         the patient's blood pressure, pulse, and oxygen                         saturations were monitored continuously. The                          Colonoscope was introduced through the anus and                         advanced to the the cecum, identified by appendiceal                         orifice and ileocecal valve. The colonoscopy was                         performed without difficulty. The patient tolerated                         the procedure well. The quality of the bowel                         preparation was excellent. Findings:      The perianal and digital rectal examinations were normal.      Three sessile polyps were found in the descending colon. The polyps were       2 to 5 mm in size. These polyps were removed with a cold snare.       Resection and retrieval were complete.      A 3 mm polyp was found in the sigmoid colon. The polyp was sessile. The       polyp was removed with a cold snare. Resection and retrieval were       complete.      Multiple small-mouthed diverticula were found in the sigmoid colon.      Non-bleeding internal hemorrhoids were found during retroflexion. The       hemorrhoids were Grade I (internal hemorrhoids that do not prolapse). Impression:            - Three 2 to 5 mm polyps in the descending colon,                         removed with a cold snare. Resected and retrieved.                        - One 3 mm polyp in the sigmoid colon, removed with a                         cold snare. Resected and retrieved.                        - Diverticulosis in the sigmoid colon.                        - Non-bleeding internal hemorrhoids. Recommendation:        - Discharge patient to home.                        -  Resume previous diet.                        - Continue present medications.                        - Await pathology results.                        - Repeat colonoscopy in 5 years for surveillance. Procedure Code(s):     --- Professional ---                        [phone], Colonoscopy, flexible; with removal of                         tumor(s), polyp(s), or other lesion(s)  by snare                         technique Diagnosis Code(s):     --- Professional ---                        Z86.010, Personal history of colonic polyps                        D12.5, Benign neoplasm of sigmoid colon CPT copyright 2022 American Medical Association. All rights reserved. The codes documented in this report are preliminary and upon coder review may  be revised to meet current compliance requirements. Rogelia Copping MD, MD 03/14/2024 8:03:38 AM This report has been signed electronically. Number of Addenda: 0 Note Initiated On: 03/14/2024 7:17 AM Scope Withdrawal Time: 0 hours 10 minutes 20 seconds  Total Procedure Duration: 0 hours 11 minutes 43 seconds  Estimated Blood Loss:  Estimated blood loss: none.      [facility]

## 2024-03-14 NOTE — Anesthesia Postprocedure Evaluation (Signed)
 Anesthesia Post Note  Patient: Adam [name]  Procedure(s) Performed: COLONOSCOPY POLYPECTOMY, INTESTINE  Patient location during evaluation: PACU Anesthesia Type: General Level of consciousness: awake Pain management: satisfactory to patient Vital Signs Assessment: post-procedure vital signs reviewed and stable Respiratory status: spontaneous breathing Cardiovascular status: stable Anesthetic complications: no   No notable events documented.   Last Vitals:  Vitals:   03/14/24 0816 03/14/24 0820  BP: 109/82 112/83  Pulse: 61 (!) 59  Resp: 15 18  Temp:    SpO2: 100%     Last Pain:  Vitals:   03/14/24 0809  TempSrc: Oral  PainSc:                  VAN STAVEREN,Aleesa Sweigert

## 2024-03-14 NOTE — Anesthesia Preprocedure Evaluation (Signed)
 Anesthesia Evaluation  Patient identified by MRN, date of birth, ID band Patient awake    Reviewed: Allergy & Precautions, NPO status , Patient's Chart, lab work & pertinent test results  Airway Mallampati: II  TM Distance: >3 FB Neck ROM: full    Dental  (+) Teeth Intact   Pulmonary neg pulmonary ROS   Pulmonary exam normal breath sounds clear to auscultation       Cardiovascular Exercise Tolerance: Good negative cardio ROS Normal cardiovascular exam Rhythm:Regular Rate:Normal     Neuro/Psych    Depression    negative neurological ROS  negative psych ROS   GI/Hepatic negative GI ROS, Neg liver ROS,,,  Endo/Other  negative endocrine ROS    Renal/GU negative Renal ROS  negative genitourinary   Musculoskeletal  (+) Arthritis ,    Abdominal Normal abdominal exam  (+)   Peds negative pediatric ROS (+)  Hematology negative hematology ROS (+)   Anesthesia Other Findings Past Medical History: No date: Arthritis     Comment:  osteoarthritis right hip No date: Hyperlipemia  Past Surgical History: No date: COLONOSCOPY 09/16/2021: INSERTION OF MESH     Comment:  Procedure: INSERTION OF MESH;  Surgeon: Tye Millet,               DO;  Location: [facility] ORS;  Service: General;; No date: none 07/30/2017: TOTAL HIP ARTHROPLASTY; Right     Comment:  Procedure: TOTAL HIP ARTHROPLASTY ANTERIOR APPROACH;                Surgeon: Leora Lynwood SAUNDERS, MD;  Location: [facility] ORS;                Service: Orthopedics;  Laterality: Right; 05/24/2018: TOTAL HIP ARTHROPLASTY; Left     Comment:  Procedure: TOTAL HIP ARTHROPLASTY ANTERIOR APPROACH;                Surgeon: Leora Lynwood SAUNDERS, MD;  Location: [facility] ORS;                Service: Orthopedics;  Laterality: Left;  BMI    Body Mass Index: 23.90 kg/m      Reproductive/Obstetrics negative OB ROS                              Anesthesia Physical Anesthesia  Plan  ASA: 2  Anesthesia Plan: General   Post-op Pain Management:    Induction: Intravenous  PONV Risk Score and Plan: Propofol  infusion and TIVA  Airway Management Planned: Natural Airway and Nasal Cannula  Additional Equipment:   Intra-op Plan:   Post-operative Plan:   Informed Consent: I have reviewed the patients History and Physical, chart, labs and discussed the procedure including the risks, benefits and alternatives for the proposed anesthesia with the patient or authorized representative who has indicated his/her understanding and acceptance.     Dental Advisory Given  Plan Discussed with: CRNA  Anesthesia Plan Comments:         Anesthesia Quick Evaluation

## 2024-03-14 NOTE — Transfer of Care (Signed)
 Immediate Anesthesia Transfer of Care Note  Patient: Adam [name]  Procedure(s) Performed: COLONOSCOPY POLYPECTOMY, INTESTINE  Patient Location: Endoscopy Unit  Anesthesia Type:General  Level of Consciousness: drowsy and patient cooperative  Airway & Oxygen Therapy: Patient Spontanous Breathing  Post-op Assessment: Report given to RN and Post -op Vital signs reviewed and stable  Post vital signs: Reviewed and stable  Last Vitals:  Vitals Value Taken Time  BP 103/82 03/14/24 08:10  Temp 35.6 C 03/14/24 08:09  Pulse 67 03/14/24 08:10  Resp 14 03/14/24 08:10  SpO2 98 % 03/14/24 08:10    Last Pain:  Vitals:   03/14/24 0809  TempSrc: Oral  PainSc:          Complications: No notable events documented.

## 2024-03-14 NOTE — Anesthesia Procedure Notes (Signed)
 Procedure Name: MAC Date/Time: 03/14/2024 7:44 AM  Performed by: Lorrene Camelia LABOR, CRNAPre-anesthesia Checklist: Patient identified, Emergency Drugs available, Suction available and Patient being monitored Patient Re-evaluated:Patient Re-evaluated prior to induction Oxygen Delivery Method: Nasal cannula Preoxygenation: Pre-oxygenation with 100% oxygen Induction Type: IV induction

## 2024-03-18 ENCOUNTER — Ambulatory Visit: Payer: Self-pay | Admitting: Gastroenterology

## 2024-03-18 LAB — SURGICAL PATHOLOGY

## 2024-03-19 ENCOUNTER — Other Ambulatory Visit: Payer: Self-pay

## 2024-05-26 ENCOUNTER — Encounter: Payer: Self-pay | Admitting: Dermatology

## 2024-05-26 ENCOUNTER — Ambulatory Visit: Admitting: Dermatology

## 2024-05-26 DIAGNOSIS — W908XXA Exposure to other nonionizing radiation, initial encounter: Secondary | ICD-10-CM

## 2024-05-26 DIAGNOSIS — L209 Atopic dermatitis, unspecified: Secondary | ICD-10-CM

## 2024-05-26 DIAGNOSIS — D1801 Hemangioma of skin and subcutaneous tissue: Secondary | ICD-10-CM | POA: Diagnosis not present

## 2024-05-26 DIAGNOSIS — L814 Other melanin hyperpigmentation: Secondary | ICD-10-CM | POA: Diagnosis not present

## 2024-05-26 DIAGNOSIS — D234 Other benign neoplasm of skin of scalp and neck: Secondary | ICD-10-CM

## 2024-05-26 DIAGNOSIS — L570 Actinic keratosis: Secondary | ICD-10-CM

## 2024-05-26 DIAGNOSIS — Z1283 Encounter for screening for malignant neoplasm of skin: Secondary | ICD-10-CM | POA: Diagnosis not present

## 2024-05-26 DIAGNOSIS — E785 Hyperlipidemia, unspecified: Secondary | ICD-10-CM | POA: Diagnosis not present

## 2024-05-26 DIAGNOSIS — D229 Melanocytic nevi, unspecified: Secondary | ICD-10-CM

## 2024-05-26 DIAGNOSIS — Z125 Encounter for screening for malignant neoplasm of prostate: Secondary | ICD-10-CM | POA: Diagnosis not present

## 2024-05-26 DIAGNOSIS — L853 Xerosis cutis: Secondary | ICD-10-CM

## 2024-05-26 DIAGNOSIS — L578 Other skin changes due to chronic exposure to nonionizing radiation: Secondary | ICD-10-CM | POA: Diagnosis not present

## 2024-05-26 DIAGNOSIS — D239 Other benign neoplasm of skin, unspecified: Secondary | ICD-10-CM

## 2024-05-26 DIAGNOSIS — L821 Other seborrheic keratosis: Secondary | ICD-10-CM | POA: Diagnosis not present

## 2024-05-26 NOTE — Progress Notes (Signed)
 New Patient Visit   Subjective  Adam [name] is a 63 y.o. male who presents for the following: Skin Cancer Screening and Full Body Skin Exam. No personal Hx of skin cancer or dysplastic nevi. No family Hx of MM.   Rough patch on right side of face. Dur: couple of years.   The patient presents for Total-Body Skin Exam (TBSE) for skin cancer screening and mole check. The patient has spots, moles and lesions to be evaluated, some may be new or changing and the patient may have concern these could be cancer.    The following portions of the chart were reviewed this encounter and updated as appropriate: medications, allergies, medical history  Review of Systems:  No other skin or systemic complaints except as noted in HPI or Assessment and Plan.  Objective  Well appearing patient in no apparent distress; mood and affect are within normal limits.  A full examination was performed including scalp, head, eyes, ears, nose, lips, neck, chest, axillae, abdomen, back, buttocks, bilateral upper extremities, bilateral lower extremities, hands, feet, fingers, toes, fingernails, and toenails. All findings within normal limits unless otherwise noted below.   Relevant physical exam findings are noted in the Assessment and Plan.    Assessment & Plan   SKIN CANCER SCREENING PERFORMED TODAY.  ACTINIC DAMAGE - Chronic condition, secondary to cumulative UV/sun exposure - diffuse scaly erythematous macules with underlying dyspigmentation - Recommend daily broad spectrum sunscreen SPF 30+ to sun-exposed areas, reapply every 2 hours as needed.  - Staying in the shade or wearing long sleeves, sun glasses (UVA+UVB protection) and wide brim hats (4-inch brim around the entire circumference of the hat) are also recommended for sun protection.  - Call for new or changing lesions.  LENTIGINES, SEBORRHEIC KERATOSES, HEMANGIOMAS - Benign normal skin lesions - Benign-appearing - Call for any  changes  MELANOCYTIC NEVI - Tan-brown and/or pink-flesh-colored symmetric macules and papules - Benign appearing on exam today - Observation - Call clinic for new or changing moles - Recommend daily use of broad spectrum spf 30+ sunscreen to sun-exposed areas.    ACTINIC KERATOSIS Exam: Erythematous thin papules/macules with gritty scale at B/L temples and dorsal nose.  Actinic keratoses are precancerous spots that appear secondary to cumulative UV radiation exposure/sun exposure over time. They are chronic with expected duration over 1 year. A portion of actinic keratoses will progress to squamous cell carcinoma of the skin. It is not possible to reliably predict which spots will progress to skin cancer and so treatment is recommended to prevent development of skin cancer.  Recommend daily broad spectrum sunscreen SPF 30+ to sun-exposed areas, reapply every 2 hours as needed.  Recommend staying in the shade or wearing long sleeves, sun glasses (UVA+UVB protection) and wide brim hats (4-inch brim around the entire circumference of the hat). Call for new or changing lesions.  Treatment Plan: Patient deferred treatment at this time.     Eczema with xerosis  Exam: scattered Erythematous papules with scale and xerosis at B/L feet and lower legs  Treatment Plan: Okay to continue using Cetaphil moisturizer.  Patient deferred treatment at this time. Will call if no longer controlled with Cetaphil products.   SEBORRHEIC KERATOSIS - Stuck-on, waxy, tan-brown papule at right breast  - Benign-appearing - Discussed benign etiology and prognosis. - Observe - Call for any changes  Blue Nevus  Exam: blue gray papule at right occipital scalp.  Treatment:  Benign-appearing.  Observation.  Call clinic for new or  changing lesions.  Recommend daily use of broad spectrum spf 30+ sunscreen to sun-exposed areas.     MULTIPLE BENIGN NEVI   LENTIGINES   ACTINIC ELASTOSIS   SEBORRHEIC  KERATOSES   CHERRY ANGIOMA   ACTINIC KERATOSES   XEROSIS CUTIS   ATOPIC DERMATITIS, UNSPECIFIED TYPE   BLUE NEVUS    Return for TBSE, HxAKs in 1-3 years.  I, Jill Parcell, CMA, am acting as scribe for Boneta Sharps, MD.   Documentation: I have reviewed the above documentation for accuracy and completeness, and I agree with the above.  Boneta Sharps, MD

## 2024-05-26 NOTE — Patient Instructions (Signed)
 Recommend daily broad spectrum sunscreen SPF 30+ to sun-exposed areas, reapply every 2 hours as needed. Call for new or changing lesions.  Staying in the shade or wearing long sleeves, sun glasses (UVA+UVB protection) and wide brim hats (4-inch brim around the entire circumference of the hat) are also recommended for sun protection.      Melanoma ABCDEs  Melanoma is the most dangerous type of skin cancer, and is the leading cause of death from skin disease.  You are more likely to develop melanoma if you: Have light-colored skin, light-colored eyes, or red or blond hair Spend a lot of time in the sun Tan regularly, either outdoors or in a tanning bed Have had blistering sunburns, especially during childhood Have a close family member who has had a melanoma Have atypical moles or large birthmarks  Early detection of melanoma is key since treatment is typically straightforward and cure rates are extremely high if we catch it early.   The first sign of melanoma is often a change in a mole or a new dark spot.  The ABCDE system is a way of remembering the signs of melanoma.  A for asymmetry:  The two halves do not match. B for border:  The edges of the growth are irregular. C for color:  A mixture of colors are present instead of an even brown color. D for diameter:  Melanomas are usually (but not always) greater than 6mm - the size of a pencil eraser. E for evolution:  The spot keeps changing in size, shape, and color.  Please check your skin once per month between visits. You can use a small mirror in front and a large mirror behind you to keep an eye on the back side or your body.   If you see any new or changing lesions before your next follow-up, please call to schedule a visit.  Please continue daily skin protection including broad spectrum sunscreen SPF 30+ to sun-exposed areas, reapplying every 2 hours as needed when you're outdoors.   Staying in the shade or wearing long sleeves,  sun glasses (UVA+UVB protection) and wide brim hats (4-inch brim around the entire circumference of the hat) are also recommended for sun protection.      Seborrheic Keratosis  What causes seborrheic keratoses? Seborrheic keratoses are harmless, common skin growths that first appear during adult life.  As time goes by, more growths appear.  Some people may develop a large number of them.  Seborrheic keratoses appear on both covered and uncovered body parts.  They are not caused by sunlight.  The tendency to develop seborrheic keratoses can be inherited.  They vary in color from skin-colored to gray, brown, or even black.  They can be either smooth or have a rough, warty surface.   Seborrheic keratoses are superficial and look as if they were stuck on the skin.  Under the microscope this type of keratosis looks like layers upon layers of skin.  That is why at times the top layer may seem to fall off, but the rest of the growth remains and re-grows.    Treatment Seborrheic keratoses do not need to be treated, but can easily be removed in the office.  Seborrheic keratoses often cause symptoms when they rub on clothing or jewelry.  Lesions can be in the way of shaving.  If they become inflamed, they can cause itching, soreness, or burning.  Removal of a seborrheic keratosis can be accomplished by freezing, burning, or surgery. If any  spot bleeds, scabs, or grows rapidly, please return to have it checked, as these can be an indication of a skin cancer.    Gentle Skin Care Guide  1. Bathe no more than once a day.  2. Avoid bathing in hot water  3. Use a mild soap like Dove, Vanicream, Cetaphil, CeraVe. Can use Lever 2000 or Cetaphil antibacterial soap  4. Use soap only where you need it. On most days, use it under your arms, between your legs, and on your feet. Let the water rinse other areas unless visibly dirty.  5. When you get out of the bath/shower, use a towel to gently blot your skin dry,  don't rub it.  6. While your skin is still a little damp, apply a moisturizing cream such as Vanicream, CeraVe, Cetaphil, Eucerin, Sarna lotion or plain Vaseline Jelly. For hands apply Neutrogena Norwegian Hand Cream or Excipial Hand Cream.  7. Reapply moisturizer any time you start to itch or feel dry.  8. Sometimes using free and clear laundry detergents can be helpful. Fabric softener sheets should be avoided. Downy Free & Gentle liquid, or any liquid fabric softener that is free of dyes and perfumes, it acceptable to use  9. If your doctor has given you prescription creams you may apply moisturizers over them       Due to recent changes in healthcare laws, you may see results of your pathology and/or laboratory studies on MyChart before the doctors have had a chance to review them. We understand that in some cases there may be results that are confusing or concerning to you. Please understand that not all results are received at the same time and often the doctors may need to interpret multiple results in order to provide you with the best plan of care or course of treatment. Therefore, we ask that you please give us  2 business days to thoroughly review all your results before contacting the office for clarification. Should we see a critical lab result, you will be contacted sooner.   If You Need Anything After Your Visit  If you have any questions or concerns for your doctor, please call our main line at [phone] and press option 4 to reach your doctor's medical assistant. If no one answers, please leave a voicemail as directed and we will return your call as soon as possible. Messages left after 4 pm will be answered the following business day.   You may also send us  a message via MyChart. We typically respond to MyChart messages within 1-2 business days.  For prescription refills, please ask your pharmacy to contact our office. Our fax number is [phone].  If you have an  urgent issue when the clinic is closed that cannot wait until the next business day, you can page your doctor at the number below.    Please note that while we do our best to be available for urgent issues outside of office hours, we are not available 24/7.   If you have an urgent issue and are unable to reach us , you may choose to seek medical care at your doctor's office, retail clinic, urgent care center, or emergency room.  If you have a medical emergency, please immediately call 911 or go to the emergency department.  Pager Numbers  - Dr. Hester: [phone]  - Dr. Jackquline: [phone]  - Dr. Claudene: [phone]   - Dr. Raymund: [phone]  In the event of inclement weather, please call our main line at [phone] for  an update on the status of any delays or closures.  Dermatology Medication Tips: Please keep the boxes that topical medications come in in order to help keep track of the instructions about where and how to use these. Pharmacies typically print the medication instructions only on the boxes and not directly on the medication tubes.   If your medication is too expensive, please contact our office at [phone] option 4 or send us  a message through MyChart.   We are unable to tell what your co-pay for medications will be in advance as this is different depending on your insurance coverage. However, we may be able to find a substitute medication at lower cost or fill out paperwork to get insurance to cover a needed medication.   If a prior authorization is required to get your medication covered by your insurance company, please allow us  1-2 business days to complete this process.  Drug prices often vary depending on where the prescription is filled and some pharmacies may offer cheaper prices.  The website www.goodrx.com contains coupons for medications through different pharmacies. The prices here do not account for what the cost may be with help from insurance  (it may be cheaper with your insurance), but the website can give you the price if you did not use any insurance.  - You can print the associated coupon and take it with your prescription to the pharmacy.  - You may also stop by our office during regular business hours and pick up a GoodRx coupon card.  - If you need your prescription sent electronically to a different pharmacy, notify our office through [facility] or by phone at [phone] option 4.     Si Usted Necesita Algo Despus de Su Visita  Tambin puede enviarnos un mensaje a travs de Clinical Cytogeneticist. Por lo general respondemos a los mensajes de MyChart en el transcurso de 1 a 2 das hbiles.  Para renovar recetas, por favor pida a su farmacia que se ponga en contacto con nuestra oficina. Randi lakes de fax es [place] [phone].  Si tiene un asunto urgente cuando la clnica est cerrada y que no puede esperar hasta el siguiente da hbil, puede llamar/localizar a su doctor(a) al nmero que aparece a continuacin.   Por favor, tenga en cuenta que aunque hacemos todo lo posible para estar disponibles para asuntos urgentes fuera del horario de [place], no estamos disponibles las 24 horas del da, los 7 [address]  [address] de la [place].   Si tiene un problema urgente y no puede comunicarse con nosotros, puede optar por buscar atencin mdica  en el consultorio de su doctor(a), en una clnica privada, en un centro de atencin urgente o en una sala de emergencias.  Si tiene engineer, drilling, por favor llame inmediatamente al 911 o vaya a la sala de emergencias.  Nmeros de bper  - Dr. Hester: [phone]  - Dra. Jackquline: [phone]  - Dr. Claudene: [phone]  - Dra. Kitts: [phone]  En caso de inclemencias del [place], por favor llame a nuestra lnea principal al [phone] para una actualizacin sobre el estado de cualquier retraso o cierre.  Consejos para la medicacin en dermatologa: Por favor, guarde las cajas en las  que vienen los medicamentos de uso tpico para ayudarle a seguir las instrucciones sobre dnde y cmo usarlos. Las farmacias generalmente imprimen las instrucciones del medicamento slo en las cajas y no directamente en los tubos del [place].   Si su medicamento es [address], por favor, pngase  en contacto con nuestra oficina llamando al [phone] y presione la opcin 4 o envenos un mensaje a travs de Clinical Cytogeneticist.   No podemos decirle cul ser su copago por los medicamentos por adelantado ya que esto es diferente dependiendo de la cobertura de su seguro. Sin embargo, es posible que podamos encontrar un medicamento sustituto a audiological scientist un formulario para que el seguro cubra el medicamento que se considera necesario.   Si se requiere una autorizacin previa para que su compaa de seguros cubra su medicamento, por favor permtanos de 1 a 2 das hbiles para completar este proceso.  Los precios de los medicamentos varan con frecuencia dependiendo del environmental consultant de dnde se surte la receta y alguna farmacias pueden ofrecer precios ms baratos.  El sitio web www.goodrx.com tiene cupones para medicamentos de health and safety inspector. Los precios aqu no tienen en cuenta lo que podra costar con la ayuda del seguro (puede ser ms barato con su seguro), pero el sitio web puede darle el precio si no utiliz tourist information centre manager.  - Puede imprimir el cupn correspondiente y llevarlo con su receta a la farmacia.  - Tambin puede pasar por nuestra oficina durante el horario de atencin regular y education officer, museum una tarjeta de cupones de GoodRx.  - Si necesita que su receta se enve electrnicamente a una farmacia diferente, informe a nuestra oficina a travs de MyChart de  o por telfono llamando al [phone] y presione la opcin 4.

## 2024-05-29 DIAGNOSIS — Z1331 Encounter for screening for depression: Secondary | ICD-10-CM | POA: Diagnosis not present

## 2024-05-29 DIAGNOSIS — Z131 Encounter for screening for diabetes mellitus: Secondary | ICD-10-CM | POA: Diagnosis not present

## 2024-05-29 DIAGNOSIS — E785 Hyperlipidemia, unspecified: Secondary | ICD-10-CM | POA: Diagnosis not present

## 2024-05-29 DIAGNOSIS — Z0000 Encounter for general adult medical examination without abnormal findings: Secondary | ICD-10-CM | POA: Diagnosis not present

## 2024-05-29 DIAGNOSIS — M519 Unspecified thoracic, thoracolumbar and lumbosacral intervertebral disc disorder: Secondary | ICD-10-CM | POA: Diagnosis not present

## 2024-05-29 DIAGNOSIS — Z96641 Presence of right artificial hip joint: Secondary | ICD-10-CM | POA: Diagnosis not present

## 2024-05-29 DIAGNOSIS — Z125 Encounter for screening for malignant neoplasm of prostate: Secondary | ICD-10-CM | POA: Diagnosis not present
